# Patient Record
Sex: Female | Born: 1960 | Race: Black or African American | Hispanic: No | Marital: Single | Smoking: Current every day smoker
Health system: Southern US, Community
[De-identification: ages and names within clinical notes are randomized; demographics above are authoritative.]

## PROBLEM LIST (undated history)

## (undated) DIAGNOSIS — I10 Essential (primary) hypertension: Secondary | ICD-10-CM

## (undated) DIAGNOSIS — D369 Benign neoplasm, unspecified site: Secondary | ICD-10-CM

## (undated) DIAGNOSIS — K219 Gastro-esophageal reflux disease without esophagitis: Secondary | ICD-10-CM

## (undated) DIAGNOSIS — D649 Anemia, unspecified: Secondary | ICD-10-CM

## (undated) DIAGNOSIS — F419 Anxiety disorder, unspecified: Secondary | ICD-10-CM

## (undated) DIAGNOSIS — H269 Unspecified cataract: Secondary | ICD-10-CM

## (undated) DIAGNOSIS — E785 Hyperlipidemia, unspecified: Secondary | ICD-10-CM

## (undated) HISTORY — DX: Gastro-esophageal reflux disease without esophagitis: K21.9

## (undated) HISTORY — DX: Unspecified cataract: H26.9

## (undated) HISTORY — DX: Benign neoplasm, unspecified site: D36.9

## (undated) HISTORY — PX: COLONOSCOPY: SHX174

## (undated) HISTORY — DX: Anemia, unspecified: D64.9

## (undated) HISTORY — DX: Anxiety disorder, unspecified: F41.9

## (undated) HISTORY — DX: Hyperlipidemia, unspecified: E78.5

## (undated) HISTORY — DX: Essential (primary) hypertension: I10

## (undated) HISTORY — PX: UPPER GASTROINTESTINAL ENDOSCOPY: SHX188

## (undated) HISTORY — PX: POLYPECTOMY: SHX149

---

## 1989-07-17 HISTORY — PX: TUBAL LIGATION: SHX77

## 1999-03-09 ENCOUNTER — Encounter: Admission: RE | Admit: 1999-03-09 | Discharge: 1999-04-06 | Payer: Self-pay

## 1999-04-12 ENCOUNTER — Encounter: Admission: RE | Admit: 1999-04-12 | Discharge: 1999-05-10 | Payer: Self-pay

## 2000-03-15 ENCOUNTER — Other Ambulatory Visit: Admission: RE | Admit: 2000-03-15 | Discharge: 2000-03-15 | Payer: Self-pay | Admitting: Family Medicine

## 2000-06-19 ENCOUNTER — Emergency Department (HOSPITAL_COMMUNITY): Admission: EM | Admit: 2000-06-19 | Discharge: 2000-06-20 | Payer: Self-pay | Admitting: Emergency Medicine

## 2000-06-19 ENCOUNTER — Encounter: Payer: Self-pay | Admitting: Emergency Medicine

## 2001-06-04 ENCOUNTER — Other Ambulatory Visit: Admission: RE | Admit: 2001-06-04 | Discharge: 2001-06-04 | Payer: Self-pay | Admitting: Family Medicine

## 2003-05-29 ENCOUNTER — Other Ambulatory Visit: Admission: RE | Admit: 2003-05-29 | Discharge: 2003-05-29 | Payer: Self-pay | Admitting: Family Medicine

## 2003-06-04 ENCOUNTER — Encounter: Admission: RE | Admit: 2003-06-04 | Discharge: 2003-06-04 | Payer: Self-pay | Admitting: Family Medicine

## 2003-07-02 ENCOUNTER — Other Ambulatory Visit: Admission: RE | Admit: 2003-07-02 | Discharge: 2003-07-02 | Payer: Self-pay | Admitting: Obstetrics and Gynecology

## 2004-01-13 ENCOUNTER — Encounter: Admission: RE | Admit: 2004-01-13 | Discharge: 2004-01-13 | Payer: Self-pay | Admitting: Family Medicine

## 2004-08-31 ENCOUNTER — Ambulatory Visit: Payer: Self-pay | Admitting: Family Medicine

## 2004-11-07 ENCOUNTER — Ambulatory Visit: Payer: Self-pay | Admitting: Family Medicine

## 2004-12-06 ENCOUNTER — Other Ambulatory Visit: Admission: RE | Admit: 2004-12-06 | Discharge: 2004-12-06 | Payer: Self-pay | Admitting: Obstetrics and Gynecology

## 2005-02-27 ENCOUNTER — Ambulatory Visit: Payer: Self-pay | Admitting: Family Medicine

## 2005-10-10 ENCOUNTER — Ambulatory Visit: Payer: Self-pay | Admitting: Family Medicine

## 2005-12-26 ENCOUNTER — Ambulatory Visit: Payer: Self-pay | Admitting: Family Medicine

## 2006-12-12 ENCOUNTER — Emergency Department (HOSPITAL_COMMUNITY): Admission: EM | Admit: 2006-12-12 | Discharge: 2006-12-12 | Payer: Self-pay | Admitting: Emergency Medicine

## 2006-12-13 ENCOUNTER — Ambulatory Visit: Payer: Self-pay | Admitting: Family Medicine

## 2006-12-14 DIAGNOSIS — F410 Panic disorder [episodic paroxysmal anxiety] without agoraphobia: Secondary | ICD-10-CM | POA: Insufficient documentation

## 2006-12-17 ENCOUNTER — Telehealth (INDEPENDENT_AMBULATORY_CARE_PROVIDER_SITE_OTHER): Payer: Self-pay | Admitting: *Deleted

## 2006-12-19 ENCOUNTER — Ambulatory Visit: Payer: Self-pay | Admitting: Family Medicine

## 2006-12-19 DIAGNOSIS — J0120 Acute ethmoidal sinusitis, unspecified: Secondary | ICD-10-CM

## 2006-12-21 LAB — CONVERTED CEMR LAB
ALT: 11 units/L (ref 0–40)
Alkaline Phosphatase: 43 units/L (ref 39–117)
BUN: 7 mg/dL (ref 6–23)
Bilirubin, Direct: 0.1 mg/dL (ref 0.0–0.3)
Calcium: 9.4 mg/dL (ref 8.4–10.5)
Eosinophils Absolute: 0.1 10*3/uL (ref 0.0–0.6)
Ferritin: 3.8 ng/mL — ABNORMAL LOW (ref 10.0–291.0)
GFR calc Af Amer: 78 mL/min
GFR calc non Af Amer: 64 mL/min
Glucose, Bld: 108 mg/dL — ABNORMAL HIGH (ref 70–99)
Lymphocytes Relative: 34.2 % (ref 12.0–46.0)
MCHC: 33.7 g/dL (ref 30.0–36.0)
MCV: 72.9 fL — ABNORMAL LOW (ref 78.0–100.0)
Monocytes Relative: 6.5 % (ref 3.0–11.0)
Neutro Abs: 4.1 10*3/uL (ref 1.4–7.7)
Platelets: 391 10*3/uL (ref 150–400)
Potassium: 3.8 meq/L (ref 3.5–5.1)
Saturation Ratios: 4.1 % — ABNORMAL LOW (ref 20.0–50.0)
Transferrin: 347.2 mg/dL (ref >=212.0)

## 2007-01-17 ENCOUNTER — Ambulatory Visit: Payer: Self-pay | Admitting: Family Medicine

## 2007-01-21 LAB — CONVERTED CEMR LAB
Basophils Absolute: 0.1 10*3/uL (ref 0.0–0.1)
Eosinophils Absolute: 0.2 10*3/uL (ref 0.0–0.6)
HCT: 38.7 % (ref 36.0–46.0)
MCHC: 33.1 g/dL (ref 30.0–36.0)
Neutrophils Relative %: 66.2 % (ref 43.0–77.0)
Platelets: 334 10*3/uL (ref 150–400)
RBC: 4.8 M/uL (ref 3.87–5.11)
RDW: 22.6 % — ABNORMAL HIGH (ref 11.5–14.6)

## 2007-04-29 ENCOUNTER — Ambulatory Visit: Payer: Self-pay | Admitting: Family Medicine

## 2007-04-29 DIAGNOSIS — E669 Obesity, unspecified: Secondary | ICD-10-CM | POA: Insufficient documentation

## 2007-04-29 DIAGNOSIS — Z8739 Personal history of other diseases of the musculoskeletal system and connective tissue: Secondary | ICD-10-CM | POA: Insufficient documentation

## 2007-05-07 ENCOUNTER — Encounter (INDEPENDENT_AMBULATORY_CARE_PROVIDER_SITE_OTHER): Payer: Self-pay | Admitting: *Deleted

## 2007-06-12 ENCOUNTER — Telehealth (INDEPENDENT_AMBULATORY_CARE_PROVIDER_SITE_OTHER): Payer: Self-pay | Admitting: *Deleted

## 2007-06-19 ENCOUNTER — Telehealth (INDEPENDENT_AMBULATORY_CARE_PROVIDER_SITE_OTHER): Payer: Self-pay | Admitting: *Deleted

## 2007-11-19 ENCOUNTER — Encounter: Payer: Self-pay | Admitting: Family Medicine

## 2008-06-18 ENCOUNTER — Ambulatory Visit: Payer: Self-pay | Admitting: Family Medicine

## 2008-06-18 DIAGNOSIS — N760 Acute vaginitis: Secondary | ICD-10-CM | POA: Insufficient documentation

## 2008-06-19 ENCOUNTER — Encounter (INDEPENDENT_AMBULATORY_CARE_PROVIDER_SITE_OTHER): Payer: Self-pay | Admitting: *Deleted

## 2008-06-19 ENCOUNTER — Telehealth (INDEPENDENT_AMBULATORY_CARE_PROVIDER_SITE_OTHER): Payer: Self-pay | Admitting: *Deleted

## 2008-06-19 LAB — CONVERTED CEMR LAB
Chlamydia, DNA Probe: NEGATIVE
GC Probe Amp, Genital: NEGATIVE

## 2008-11-12 ENCOUNTER — Telehealth (INDEPENDENT_AMBULATORY_CARE_PROVIDER_SITE_OTHER): Payer: Self-pay | Admitting: *Deleted

## 2009-04-01 ENCOUNTER — Ambulatory Visit: Payer: Self-pay | Admitting: Family Medicine

## 2009-04-01 DIAGNOSIS — D239 Other benign neoplasm of skin, unspecified: Secondary | ICD-10-CM | POA: Insufficient documentation

## 2009-04-01 DIAGNOSIS — M26629 Arthralgia of temporomandibular joint, unspecified side: Secondary | ICD-10-CM

## 2009-04-26 ENCOUNTER — Encounter: Payer: Self-pay | Admitting: Family Medicine

## 2009-07-05 ENCOUNTER — Telehealth: Payer: Self-pay | Admitting: Family Medicine

## 2009-09-06 ENCOUNTER — Telehealth: Payer: Self-pay | Admitting: Family Medicine

## 2009-10-22 ENCOUNTER — Telehealth: Payer: Self-pay | Admitting: Family Medicine

## 2009-11-10 ENCOUNTER — Telehealth: Payer: Self-pay | Admitting: Family Medicine

## 2009-12-08 ENCOUNTER — Encounter: Payer: Self-pay | Admitting: Family Medicine

## 2009-12-08 ENCOUNTER — Telehealth (INDEPENDENT_AMBULATORY_CARE_PROVIDER_SITE_OTHER): Payer: Self-pay | Admitting: *Deleted

## 2009-12-08 ENCOUNTER — Telehealth: Payer: Self-pay | Admitting: Family Medicine

## 2009-12-08 ENCOUNTER — Ambulatory Visit: Payer: Self-pay | Admitting: Family

## 2009-12-08 DIAGNOSIS — R030 Elevated blood-pressure reading, without diagnosis of hypertension: Secondary | ICD-10-CM

## 2009-12-09 LAB — CONVERTED CEMR LAB
Trich, Wet Prep: NONE SEEN
Yeast Wet Prep HPF POC: NONE SEEN

## 2009-12-14 ENCOUNTER — Telehealth (INDEPENDENT_AMBULATORY_CARE_PROVIDER_SITE_OTHER): Payer: Self-pay | Admitting: *Deleted

## 2009-12-14 LAB — CONVERTED CEMR LAB: Chlamydia, DNA Probe: NEGATIVE

## 2010-01-20 ENCOUNTER — Telehealth: Payer: Self-pay | Admitting: Family Medicine

## 2010-02-10 ENCOUNTER — Other Ambulatory Visit: Admission: RE | Admit: 2010-02-10 | Discharge: 2010-02-10 | Payer: Self-pay | Admitting: Family Medicine

## 2010-02-10 ENCOUNTER — Ambulatory Visit: Payer: Self-pay | Admitting: Family Medicine

## 2010-02-10 DIAGNOSIS — D509 Iron deficiency anemia, unspecified: Secondary | ICD-10-CM | POA: Insufficient documentation

## 2010-02-10 DIAGNOSIS — F411 Generalized anxiety disorder: Secondary | ICD-10-CM | POA: Insufficient documentation

## 2010-02-11 ENCOUNTER — Telehealth (INDEPENDENT_AMBULATORY_CARE_PROVIDER_SITE_OTHER): Payer: Self-pay | Admitting: *Deleted

## 2010-02-11 LAB — CONVERTED CEMR LAB
AST: 20 units/L (ref 0–37)
Albumin: 4 g/dL (ref 3.5–5.2)
Alkaline Phosphatase: 38 units/L — ABNORMAL LOW (ref 39–117)
Basophils Relative: 0.4 % (ref 0.0–3.0)
Bilirubin, Direct: 0.1 mg/dL (ref 0.0–0.3)
Calcium: 9 mg/dL (ref 8.4–10.5)
Creatinine, Ser: 0.8 mg/dL (ref 0.4–1.2)
Eosinophils Absolute: 0.1 10*3/uL (ref 0.0–0.7)
Ferritin: 2.2 ng/mL — ABNORMAL LOW (ref 10.0–291.0)
Folate: 12 ng/mL
GFR calc non Af Amer: 98.85 mL/min (ref >60)
HDL: 51.9 mg/dL (ref >39.00)
Hemoglobin: 8.5 g/dL — ABNORMAL LOW (ref 12.0–15.0)
LDL Cholesterol: 116 mg/dL — ABNORMAL HIGH (ref 0–99)
Lymphocytes Relative: 30.8 % (ref 12.0–46.0)
MCHC: 31.5 g/dL (ref 30.0–36.0)
Monocytes Relative: 6.2 % (ref 3.0–12.0)
Neutro Abs: 4.3 10*3/uL (ref 1.4–7.7)
Neutrophils Relative %: 61.3 % (ref 43.0–77.0)
RBC: 3.76 M/uL — ABNORMAL LOW (ref 3.87–5.11)
Saturation Ratios: 3.5 % — ABNORMAL LOW (ref 20.0–50.0)
Sodium: 139 meq/L (ref 135–145)
Total CHOL/HDL Ratio: 4
Total Protein: 7 g/dL (ref 6.0–8.3)
Transferrin: 345.4 mg/dL (ref 212.0–360.0)
Triglycerides: 91 mg/dL (ref 0.0–149.0)
VLDL: 18.2 mg/dL (ref 0.0–40.0)
WBC: 7.1 10*3/uL (ref 4.5–10.5)

## 2010-02-16 ENCOUNTER — Ambulatory Visit: Payer: Self-pay | Admitting: Internal Medicine

## 2010-02-16 LAB — CONVERTED CEMR LAB: Pap Smear: NEGATIVE

## 2010-02-22 ENCOUNTER — Encounter: Payer: Self-pay | Admitting: Family Medicine

## 2010-02-22 LAB — CBC & DIFF AND RETIC
BASO%: 0.4 % (ref 0.0–2.0)
EOS%: 1.3 % (ref 0.0–7.0)
Immature Retic Fract: 16.3 % — ABNORMAL HIGH (ref 0.00–10.70)
MCH: 21.5 pg — ABNORMAL LOW (ref 25.1–34.0)
MCV: 73.3 fL — ABNORMAL LOW (ref 79.5–101.0)
MONO%: 6.4 % (ref 0.0–14.0)
RBC: 4.46 10*6/uL (ref 3.70–5.45)
RDW: 18.3 % — ABNORMAL HIGH (ref 11.2–14.5)
Retic %: 0.91 % (ref 0.50–1.50)
Retic Ct Abs: 40.59 10*3/uL (ref 18.30–72.70)
lymph#: 1.3 10*3/uL (ref 0.9–3.3)

## 2010-02-24 LAB — IRON AND TIBC
Iron: 18 ug/dL — ABNORMAL LOW (ref 42–145)
UIBC: 510 ug/dL

## 2010-02-24 LAB — COMPREHENSIVE METABOLIC PANEL
AST: 16 U/L (ref 0–37)
Alkaline Phosphatase: 52 U/L (ref 39–117)
BUN: 8 mg/dL (ref 6–23)
Calcium: 9.4 mg/dL (ref 8.4–10.5)
Creatinine, Ser: 0.92 mg/dL (ref 0.40–1.20)
Total Bilirubin: 0.4 mg/dL (ref 0.3–1.2)

## 2010-02-24 LAB — PROTEIN ELECTROPHORESIS, SERUM
Albumin ELP: 56.6 % (ref 55.8–66.1)
Alpha-1-Globulin: 4.1 % (ref 2.9–4.9)
Alpha-2-Globulin: 10.6 % (ref 7.1–11.8)
Beta 2: 4.8 % (ref 3.2–6.5)
Beta Globulin: 7.3 % — ABNORMAL HIGH (ref 4.7–7.2)

## 2010-05-16 ENCOUNTER — Ambulatory Visit: Payer: Self-pay | Admitting: Internal Medicine

## 2010-08-18 NOTE — Progress Notes (Signed)
Summary: Refill Request  Phone Note Refill Request Message from:  Pharmacy on [facility] [address] Fax #: [phone]  Refills Requested: Medication #1:  XANAX 0.25 MG  TABS take one half to one tablet every eight hours as needed for anxiety   Dosage confirmed as above?Dosage Confirmed   Brand Name Necessary? No   Last Refilled: 07/05/2009 Initial call taken by: Harold Barban,  September 06, 2009 9:14 AM  Follow-up for Phone Call        last ov- 04/01/09, last filled 07/05/09. Army Fossa CMA  September 06, 2009 9:36 AM   Additional Follow-up for Phone Call Additional follow up Details #1::        ok to refill x1  1 refills Additional Follow-up by: Loreen Freud DO,  September 06, 2009 10:30 AM    Prescriptions: Prudy Feeler 0.25 MG  TABS (ALPRAZOLAM) take one half to one tablet every eight hours as needed for anxiety  #20 x 1   Entered by:   Army Fossa CMA   Authorized by:   Loreen Freud DO   Signed by:   Army Fossa CMA on 09/06/2009   Method used:   Printed then faxed to ...       [id number] (retail)       [address]       [place], [place]  [address]       Ph: [phone] or [phone]       Fax: [phone]   RxID:   [id number]

## 2010-08-18 NOTE — Consult Note (Signed)
[facility]   Imported By: Lanelle Bal 03/15/2010 09:51:02  _____________________________________________________________________  External Attachment:    Type:   Image     Comment:   External Document

## 2010-08-18 NOTE — Progress Notes (Signed)
Summary: refill  Phone Note Refill Request Message from:  Fax from Pharmacy on January 20, 2010 11:06 AM  Refills Requested: Medication #1:  XANAX 0.25 MG  TABS take one half to one tablet every eight hours as needed for anxiety [facility] [address] [facility] fax [phone] Jani Files [phone]  Initial call taken by: Okey Regal Spring,  January 20, 2010 11:07 AM  Follow-up for Phone Call        last ov 12-08-09, last filled 12-09-09#30..........Marland KitchenFelecia Deloach CMA  January 20, 2010 3:14 PM   Additional Follow-up for Phone Call Additional follow up Details #1::        refill x1 1 refills Additional Follow-up by: Loreen Freud DO,  January 20, 2010 3:40 PM    Prescriptions: XANAX 0.25 MG  TABS (ALPRAZOLAM) take one half to one tablet every eight hours as needed for anxiety  #30 x 1   Entered by:   Jeremy Johann CMA   Authorized by:   Loreen Freud DO   Signed by:   Jeremy Johann CMA on 01/20/2010   Method used:   Printed then faxed to ...       [id number] (retail)       [address]       [place], [place]  [address]       Ph: [phone] or [phone]       Fax: [phone]   RxID:   [id number]

## 2010-08-18 NOTE — Progress Notes (Signed)
Summary: refill  Phone Note Refill Request Message from:  Fax from Pharmacy on Dec 08, 2009 3:33 PM  Refills Requested: Medication #1:  XANAX 0.25 MG  TABS take one half to one tablet every eight hours as needed for anxiety   Last Refilled: 11/10/2009 fax from [address] - fax [phone] -- ph [phone]   Method Requested: Fax to Local Pharmacy Initial call taken by: Okey Regal Spring,  Dec 08, 2009 3:36 PM  Follow-up for Phone Call        last ov- 12/08/09. Army Fossa CMA  Dec 08, 2009 3:37 PM   Additional Follow-up for Phone Call Additional follow up Details #1::        refill x1  Additional Follow-up by: Loreen Freud DO,  Dec 09, 2009 8:35 AM    Prescriptions: Prudy Feeler 0.25 MG  TABS (ALPRAZOLAM) take one half to one tablet every eight hours as needed for anxiety  #30 x 0   Entered by:   Army Fossa CMA   Authorized by:   Loreen Freud DO   Signed by:   Army Fossa CMA on 12/09/2009   Method used:   Printed then faxed to ...       [id number] (retail)       [address]       [place], [place]  [address]       Ph: [phone] or [phone]       Fax: [phone]   RxID:   [id number]

## 2010-08-18 NOTE — Assessment & Plan Note (Signed)
Summary: CPX,WANTS PAP,PT FASTING,[employer] INS/RH......Marland Kitchen   Vital Signs:  Patient profile:   50 year old female Height:      64.5 inches Weight:      141 pounds Temp:     97.8 degrees F oral Pulse rate:   66 / minute Resp:     18 per minute BP sitting:   130 / 90  (left arm)  Vitals Entered By: Jeremy Johann CMA (February 10, 2010 1:13 PM) CC: cpx,pap, fasting   History of Present Illness: Pt here for cpe, pap and labs.  No complaints.    Preventive Screening-Counseling & Management  Alcohol-Tobacco     Alcohol drinks/day: <1     Alcohol type: wine     Smoking Status: quit     Smoke Cessation Stage: quit     Packs/Day: 1.0     Year Started: 1983     Year Quit: 2002  Caffeine-Diet-Exercise     Caffeine use/day: 1     Diet Comments: healthy     Does Patient Exercise: yes     Type of exercise: walking , aerobics classes      Exercise (avg: min/session): >60     Times/week: 4  Hep-HIV-STD-Contraception     Dental Visit-last 6 months no     Dental Care Counseling: to seek dental care; no dental care within six months     SBE monthly: yes     SBE Education/Counseling: not indicated; SBE done regularly  Safety-Violence-Falls     Seat Belt Use: yes     Sexual Abuse: no      Sexual History:  currently monogamous.        Drug Use:  no.    Current Medications (verified): 1)  Xanax 0.25 Mg  Tabs (Alprazolam) .... Take One Half To One Tablet Every Eight Hours As Needed For Anxiety  Allergies (verified): 1)  ! Codeine  Past History:  Past Medical History: Acne Anxiety  Past Surgical History: Tubal ligation (1993)  Family History: Family History of Anxiety Family History Diabetes 1st degree relative Family History Hypertension Family History Psychiatric care Family History of Alcoholism/Addiction Family History of Stroke F 48 yo  Social History: Occupation:  Education officer, environmental Single Former Smoker Alcohol use-yes Drug use-no Regular exercise-yes Does Patient  Exercise:  yes Smoking Status:  quit Packs/Day:  1.0 Caffeine use/day:  1 Dental Care w/in 6 mos.:  no Seat Belt Use:  yes Sexual History:  currently monogamous Occupation:  employed Drug Use:  no  Review of Systems      See HPI General:  Denies chills, fatigue, fever, loss of appetite, malaise, sleep disorder, sweats, weakness, and weight loss. Eyes:  Denies blurring, discharge, double vision, eye irritation, eye pain, halos, itching, light sensitivity, red eye, vision loss-1 eye, and vision loss-both eyes; optho q1y--  + contacts. ENT:  Denies decreased hearing, difficulty swallowing, ear discharge, earache, hoarseness, nasal congestion, nosebleeds, postnasal drainage, ringing in ears, sinus pressure, and sore throat. CV:  Denies bluish discoloration of lips or nails, chest pain or discomfort, difficulty breathing at night, difficulty breathing while lying down, fainting, fatigue, leg cramps with exertion, lightheadness, near fainting, palpitations, shortness of breath with exertion, swelling of feet, swelling of hands, and weight gain. Resp:  Denies chest discomfort, chest pain with inspiration, cough, coughing up blood, excessive snoring, hypersomnolence, morning headaches, pleuritic, shortness of breath, sputum productive, and wheezing. GI:  Denies abdominal pain, bloody stools, change in bowel habits, constipation, dark tarry stools, diarrhea, excessive  appetite, gas, hemorrhoids, indigestion, loss of appetite, and nausea. GU:  Denies abnormal vaginal bleeding, decreased libido, discharge, dysuria, genital sores, hematuria, incontinence, nocturia, urinary frequency, and urinary hesitancy. MS:  Denies joint pain, joint redness, joint swelling, loss of strength, low back pain, mid back pain, muscle aches, muscle , cramps, muscle weakness, stiffness, and thoracic pain. Derm:  Denies changes in color of skin, changes in nail beds, dryness, excessive perspiration, flushing, hair loss, insect  bite(s), itching, lesion(s), poor wound healing, and rash. Neuro:  Denies brief paralysis, difficulty with concentration, disturbances in coordination, falling down, headaches, inability to speak, memory loss, numbness, poor balance, seizures, sensation of room spinning, tingling, tremors, visual disturbances, and weakness. Psych:  Denies alternate hallucination ( auditory/visual), anxiety, depression, easily angered, easily tearful, irritability, mental problems, panic attacks, sense of great danger, suicidal thoughts/plans, thoughts of violence, unusual visions or sounds, and thoughts /plans of harming others. Endo:  Denies cold intolerance, excessive hunger, excessive thirst, excessive urination, heat intolerance, polyuria, and weight change. Heme:  Denies abnormal bruising, bleeding, enlarge lymph nodes, fevers, pallor, and skin discoloration. Allergy:  Denies hives or rash, itching eyes, persistent infections, seasonal allergies, and sneezing.  Physical Exam  General:  Well-developed,well-nourished,in no acute distress; alert,appropriate and cooperative throughout examination Head:  Normocephalic and atraumatic without obvious abnormalities. No apparent alopecia or balding. Eyes:  vision grossly intact, pupils equal, pupils round, pupils reactive to light, and no injection.   Ears:  External ear exam shows no significant lesions or deformities.  Otoscopic examination reveals clear canals, tympanic membranes are intact bilaterally without bulging, retraction, inflammation or discharge. Hearing is grossly normal bilaterally. Nose:  External nasal examination shows no deformity or inflammation. Nasal mucosa are pink and moist without lesions or exudates. Mouth:  Oral mucosa and oropharynx without lesions or exudates.  Teeth in good repair. Neck:  No deformities, masses, or tenderness noted. Chest Wall:  No deformities, masses, or tenderness noted. Breasts:  No mass, nodules, thickening,  tenderness, bulging, retraction, inflamation, nipple discharge or skin changes noted.   Lungs:  Normal respiratory effort, chest expands symmetrically. Lungs are clear to auscultation, no crackles or wheezes. Heart:  normal rate and no murmur.   Abdomen:  Bowel sounds positive,abdomen soft and non-tender without masses, organomegaly or hernias noted. Rectal:  No external abnormalities noted. Normal sphincter tone. No rectal masses or tenderness. heme neg brown stool Genitalia:  Pelvic Exam:        External: normal female genitalia without lesions or masses        Vagina: normal without lesions or masses        Cervix: normal without lesions or masses        Adnexa: normal bimanual exam without masses or fullness        Uterus: normal by palpation        Pap smear: performed Msk:  normal ROM, no joint tenderness, no joint swelling, no joint warmth, no redness over joints, no joint deformities, no joint instability, and no crepitation.   Pulses:  R posterior tibial normal, R dorsalis pedis normal, R carotid normal, L posterior tibial normal, L dorsalis pedis normal, and L carotid normal.   Extremities:  No clubbing, cyanosis, edema, or deformity noted with normal full range of motion of all joints.   Neurologic:  No cranial nerve deficits noted. Station and gait are normal. Plantar reflexes are down-going bilaterally. DTRs are symmetrical throughout. Sensory, motor and coordinative functions appear intact. Skin:  Intact without suspicious lesions or  rashes Cervical Nodes:  No lymphadenopathy noted Axillary Nodes:  No palpable lymphadenopathy Psych:  Cognition and judgment appear intact. Alert and cooperative with normal attention span and concentration. No apparent delusions, illusions, hallucinations   Impression & Recommendations:  Problem # 1:  PREVENTIVE HEALTH CARE (ICD-V70.0)  Orders: Radiology Referral (Radiology) Venipuncture ([address]) TLB-Lipid Panel (80061-LIPID) TLB-BMP (Basic  Metabolic Panel-BMET) (80048-METABOL) TLB-CBC Platelet - w/Differential (85025-CBCD) TLB-Hepatic/Liver Function Pnl (80076-HEPATIC) TLB-TSH (Thyroid Stimulating Hormone) (84443-TSH) EKG w/ Interpretation ([address])  Problem # 2:  ANEMIA, IRON DEFICIENCY (ICD-280.9)  Orders: TLB-IBC Pnl (Iron/FE;Transferrin) (83550-IBC) TLB-B12 + Folate Pnl (60454_09811-B14/NWG) TLB-Ferritin (82728-FER) EKG w/ Interpretation ([address])  Hgb: 12.8 (01/17/2007)   Hct: 38.7 (01/17/2007)   Platelets: 334 (01/17/2007) RBC: 4.80 (01/17/2007)   RDW: 22.6 (01/17/2007)   WBC: 8.5 (01/17/2007) MCV: 80.6 (01/17/2007)   MCHC: 33.1 (01/17/2007) Ferritin: 3.8 (12/13/2006) Iron: 20 (12/13/2006)   % Sat: 4.1 (12/13/2006) TSH: 1.99 (12/13/2006)  Problem # 3:  ANXIETY (ICD-300.00)  Her updated medication list for this problem includes:    Alprazolam 0.5 Mg Tabs (Alprazolam) .Marland Kitchen... 1 by mouth once daily as needed  Orders: EKG w/ Interpretation ([address])  Complete Medication List: 1)  Alprazolam 0.5 Mg Tabs (Alprazolam) .Marland Kitchen.. 1 by mouth once daily as needed 2)  Caltrate 600+d Plus 600-400 Mg-unit Chew (Calcium carbonate-vit d-min) .Marland Kitchen.. 1 by mouth two times a day 3)  Womens Multivitamin Plus Tabs (Multiple vitamins-minerals) .Marland Kitchen.. 1 by mouth once daily  Patient Instructions: 1)  take MVI with iron ---we will call with labs results Prescriptions: ALPRAZOLAM 0.5 MG TABS (ALPRAZOLAM) 1 by mouth once daily as needed  #30 x 2   Entered and Authorized by:   Loreen Freud DO   Signed by:   Loreen Freud DO on 02/10/2010   Method used:   Print then Give to Patient   RxID:   [id number]     EKG  Procedure date:  02/10/2010  Findings:      Sinus bradycardia with rate of:  53 bpm   Flu Vaccine Next Due:  Refused  Appended Document: CPX,WANTS PAP,PT FASTING,UHC INS/RH......Marland Kitchen   Appended Document: CPX,WANTS PAP,PT FASTING,UHC INS/RH......Marland Kitchen  Laboratory Results   Urine Tests   Date/Time Reported: February 10, 2010  2:01 PM   Routine Urinalysis   Color: yellow Appearance: Clear Glucose: negative   (Normal Range: Negative) Bilirubin: negative   (Normal Range: Negative) Ketone: negative   (Normal Range: Negative) Spec. Gravity: <1.005   (Normal Range: 1.003-1.035) Blood: negative   (Normal Range: Negative) pH: 6.5   (Normal Range: 5.0-8.0) Protein: negative   (Normal Range: Negative) Urobilinogen: negative   (Normal Range: 0-1) Nitrite: negative   (Normal Range: Negative) Leukocyte Esterace: negative   (Normal Range: Negative)    Comments: Floydene Flock  February 10, 2010 2:02 PM

## 2010-08-18 NOTE — Progress Notes (Signed)
Summary: lab results  Phone Note Outgoing Call   Call placed by: Army Fossa CMA,  Dec 14, 2009 11:21 AM Reason for Call: Discuss lab or test results Summary of Call: Regarding lab results, LMTCB:  Chalyamdia/GC: negative Signed by Loreen Freud DO on 12/14/2009 at 8:35 AM  Follow-up for Phone Call        Pt is aware. Army Fossa CMA  Dec 14, 2009 11:28 AM

## 2010-08-18 NOTE — Assessment & Plan Note (Signed)
Summary: vaginal discharge/odor/cbs   Vital Signs:  Patient profile:   50 year old female Height:      63.5 inches Weight:      138.50 pounds BMI:     24.24 Temp:     98.2 degrees F oral Pulse rate:   78 / minute Pulse rhythm:   regular Resp:     12 per minute BP sitting:   142 / 80  (right arm) Cuff size:   regular  Vitals Entered By: Jean Blair CMA (Dec 08, 2009 11:23 AM) CC: Room 5  Pt states she has had vaginal discharge twice since last Sunday. Notes that is has some odor to it. Is Patient Diabetic? No Comments Pt thought her last pap smear was within the last year with Dr. Laury Axon but we do not have record of one being performed.   Primary Care Provider:  [facility]ne  CC:  Room 5  Pt states she has had vaginal discharge twice since last Sunday. Notes that is has some odor to it.Marland Kitchen  History of Present Illness: Jean Blair is a 49 year old female who presents today with complaint of vaginal odor and milky discharge.  Reports odor is "fishy" , denies vaginal itching. Notes that this odor started after intercourse.  She reports that this is the first time in 1 month that she and her partner had intercourse and symptoms started after intercourse.  She is concerned about the possibility of STD and wishes to be tested.  Allergies: 1)  ! Codeine  Physical Exam  General:  Well-developed,well-nourished,in no acute distress; alert,appropriate and cooperative throughout examination Genitalia:  Pelvic Exam:        External: normal female genitalia without lesions or masses        Vagina: normal without lesions or masses, thin white discharge is noted        Cervix: normal without lesions or masses              Pap smear: not performed   Impression & Recommendations:  Problem # 1:  BACTERIAL VAGINITIS (ICD-616.10) Assessment New Clinically appears to be BV.  Will treat with metrogel.  Will also send wet prep, GC/Chlamydia.   Her updated medication list for this problem includes:    Metrogel-vaginal 0.75 % Gel (Metronidazole) ..... One applicator full in vagina at bedtime x 5 days  Problem # 2:  ELEVATED BLOOD PRESSURE (ICD-796.2) Assessment: New Recommended low sodium diet and follow up with Dr. Laury Axon for CPX and BP follow up.  Pt verbalizes understanding. BP today: 142/80 Prior BP: 138/90 (04/01/2009)  Labs Reviewed: Creat: 1.0 (12/13/2006)  Instructed in low sodium diet (DASH Handout) and behavior modification.    Complete Medication List: 1)  Xanax 0.25 Mg Tabs (Alprazolam) .... Take one half to one tablet every eight hours as needed for anxiety 2)  Metrogel-vaginal 0.75 % Gel (Metronidazole) .... One applicator full in vagina at bedtime x 5 days  Patient Instructions: 1)  Please schedule an appointment with Dr. Laury Axon for a Pap and complete physical, and follow up of your blood pressure. 2)  Call if your symptoms worsen or do not improve. 3)  Limit your Sodium (Salt). Prescriptions: METROGEL-VAGINAL 0.75 % GEL (METRONIDAZOLE) one applicator full in Vagina at bedtime x 5 days  #1 x 0   Entered and Authorized by:   Jean Fillers FNP   Signed by:   Jean Fillers FNP on 12/08/2009   Method used:   Electronically to  [id number])       [address]       [place], [place]  [address]       Ph: [phone] or [phone]       Fax: [phone]   RxID:   [id number]      Current Allergies (reviewed today): ! CODEINE  Appended Document: vaginal discharge/odor/cbs    Clinical Lists Changes  Orders: Added new Test order of T-Wet Prep by Molecular Probe [phone]) - Signed Added new Test order of T-Chlamydia & GC Probe, Genital ([phone]) - Signed

## 2010-08-18 NOTE — Progress Notes (Signed)
Summary: discharge  Phone Note Call from Patient Call back at Home Phone [phone]   Caller: Patient Summary of Call: patient has vaginal discharge with odor - she said she has had this before her ph balance is off - no appts available today Initial call taken by: Okey Regal Spring,  Dec 08, 2009 9:16 AM  Follow-up for Phone Call        left pt detail message OV needed and osullivan has some opening today call to schedule appt................Marland KitchenFelecia Deloach CMA  Dec 08, 2009 9:34 AM   pt going to see osullivan today...........Marland KitchenFelecia Deloach CMA  Dec 08, 2009 10:00 AM

## 2010-08-18 NOTE — Progress Notes (Signed)
Summary: Refill Request  Phone Note Refill Request Call back at [phone] Message from:  Pharmacy on November 10, 2009 12:44 PM  Refills Requested: Medication #1:  XANAX 0.25 MG  TABS take one half to one tablet every eight hours as needed for anxiety   Dosage confirmed as above?Dosage Confirmed   Brand Name Necessary? No   Supply Requested: 1 month   [facility] [address]   Next Appointment Scheduled: none Initial call taken by: Harold Barban,  November 10, 2009 12:44 PM  Follow-up for Phone Call        last ov- 04/01/09. Army Fossa CMA  November 10, 2009 12:52 PM   Additional Follow-up for Phone Call Additional follow up Details #1::        fill #30  no refills Additional Follow-up by: Loreen Freud DO,  November 10, 2009 2:07 PM    Prescriptions: XANAX 0.25 MG  TABS (ALPRAZOLAM) take one half to one tablet every eight hours as needed for anxiety  #30 x 0   Entered by:   Army Fossa CMA   Authorized by:   Loreen Freud DO   Signed by:   Army Fossa CMA on 11/10/2009   Method used:   Printed then faxed to ...       [id number]       [address]       [place], [place]  [address]       Ph: [phone] or [phone]       Fax: [phone]   RxID:   [id number]

## 2010-08-18 NOTE — Progress Notes (Signed)
Summary: -lab results  Phone Note Outgoing Call   Details for Reason: + very anemic!!----take slow Fe two times a day ---we will refer to hematology---to further evaluate and for possible iron  infusion  Summary of Call: Left message on voicemail to call back to office.Signed by Lucious Groves CMA on 02/11/2010 at 1:53 PM  left message to call office................Marland KitchenFelecia Deloach CMA  February 16, 2010 11:54 AM   Follow-up for Phone Call        pt aware................Marland KitchenFelecia Deloach CMA  February 16, 2010 4:07 PM

## 2010-08-18 NOTE — Progress Notes (Signed)
Summary: Refill Request  Phone Note Refill Request Message from:  [facility] [address] Fax #: [phone]  Refills Requested: Medication #1:  XANAX 0.25 MG  TABS take one half to one tablet every eight hours as needed for anxiety   Dosage confirmed as above?Dosage Confirmed   Brand Name Necessary? No   Supply Requested: 1 month   Last Refilled: 09/20/2009 Next Appointment Scheduled: none Initial call taken by: Harold Barban,  October 22, 2009 8:42 AM  Follow-up for Phone Call        last ov- 04/01/2009. Army Fossa CMA  October 22, 2009 8:51 AM   Additional Follow-up for Phone Call Additional follow up Details #1::        refillx1 Additional Follow-up by: Loreen Freud DO,  October 22, 2009 9:01 AM    Prescriptions: Prudy Feeler 0.25 MG  TABS (ALPRAZOLAM) take one half to one tablet every eight hours as needed for anxiety  #20 x 0   Entered by:   Army Fossa CMA   Authorized by:   Loreen Freud DO   Signed by:   Army Fossa CMA on 10/22/2009   Method used:   Printed then faxed to ...       [id number]       [address]       [place], [place]  [address]       Ph: [phone] or [phone]       Fax: [phone]   RxID:   [id number]

## 2010-10-10 ENCOUNTER — Other Ambulatory Visit: Payer: Self-pay | Admitting: Family Medicine

## 2010-10-11 MED ORDER — ALPRAZOLAM 0.5 MG PO TABS: ORAL_TABLET | ORAL | Status: DC

## 2010-10-11 NOTE — Telephone Encounter (Signed)
Rx faxed.    KP 

## 2010-10-11 NOTE — Telephone Encounter (Signed)
Last seen and filled 02/10/10 please advise      KP

## 2011-01-23 ENCOUNTER — Other Ambulatory Visit: Payer: Self-pay | Admitting: Family Medicine

## 2011-01-23 MED ORDER — ALPRAZOLAM 0.5 MG PO TABS: ORAL_TABLET | ORAL | Status: DC

## 2011-01-23 NOTE — Telephone Encounter (Signed)
Letter mailed to schedule and appt     KP

## 2011-01-23 NOTE — Telephone Encounter (Signed)
Refill x1 only Pt needs ov

## 2011-01-23 NOTE — Telephone Encounter (Signed)
Last seen 02/10/10 and filled 10/10/10 please advise    KP

## 2011-11-17 ENCOUNTER — Telehealth: Payer: Self-pay | Admitting: Family Medicine

## 2011-11-17 NOTE — Telephone Encounter (Signed)
Scheduled apt 12/06/11 at 1 pm.   KP

## 2011-11-17 NOTE — Telephone Encounter (Signed)
Patient states she needs a CPE and medication refills She needs to be seen before 5.20.13, because her job will take her out of town for 2-3 months Can she come in for a medication f/u 1st & schedule a CPE for a later date to accomodate her schedule? Patient pH# [phone]

## 2011-11-17 NOTE — Telephone Encounter (Signed)
Can't we take care of it on 5/22?

## 2011-11-20 NOTE — Telephone Encounter (Signed)
LMOM stating made CPE for 5.22.13 at 1pm to hold slot & advised patient we would do a medication f/u appt this week so she can continue to receive her medications.

## 2011-11-20 NOTE — Telephone Encounter (Signed)
Made appt for medication f/u tomorrow at 2:30pm. Patient was unable to do 5.22.13 at 1pm for CPE she will be in [place] the rest of the month. She will call me when she returns to see if something has opened up that fits her schedule

## 2011-11-21 ENCOUNTER — Encounter: Payer: Self-pay | Admitting: Family Medicine

## 2011-11-21 ENCOUNTER — Ambulatory Visit (INDEPENDENT_AMBULATORY_CARE_PROVIDER_SITE_OTHER): Payer: BC Managed Care – PPO | Admitting: Family Medicine

## 2011-11-21 VITALS — BP 120/70 | HR 76 | Temp 98.2°F | Wt 158.6 lb

## 2011-11-21 DIAGNOSIS — E663 Overweight: Secondary | ICD-10-CM

## 2011-11-21 DIAGNOSIS — Z6825 Body mass index (BMI) 25.0-25.9, adult: Secondary | ICD-10-CM

## 2011-11-21 DIAGNOSIS — F411 Generalized anxiety disorder: Secondary | ICD-10-CM

## 2011-11-21 DIAGNOSIS — F419 Anxiety disorder, unspecified: Secondary | ICD-10-CM

## 2011-11-21 MED ORDER — ALPRAZOLAM 0.5 MG PO TABS: ORAL_TABLET | ORAL | Status: DC

## 2011-11-21 MED ORDER — PHENTERMINE HCL 37.5 MG PO TABS: ORAL_TABLET | ORAL | Status: DC

## 2011-11-21 NOTE — Assessment & Plan Note (Signed)
Refill meds stable 

## 2011-11-21 NOTE — Assessment & Plan Note (Signed)
con't with diet and exercise adipex for 1 month rto 1 month

## 2011-11-21 NOTE — Patient Instructions (Signed)
Calorie Counting Diet A calorie counting diet requires you to eat the number of calories that are right for you in a day. Calories are the measurement of how much energy you get from the food you eat. Eating the right amount of calories is important for staying at a healthy weight. If you eat too many calories, your body will store them as fat and you may gain weight. If you eat too few calories, you may lose weight. Counting the number of calories you eat during a day will help you know if you are eating the right amount. A Registered Dietitian can determine how many calories you need in a day. The amount of calories needed varies from person to person. If your goal is to lose weight, you will need to eat fewer calories. Losing weight can benefit you if you are overweight or have health problems such as heart disease, high blood pressure, or diabetes. If your goal is to gain weight, you will need to eat more calories. Gaining weight may be necessary if you have a certain health problem that causes your body to need more energy. TIPS Whether you are increasing or decreasing the number of calories you eat during a day, it may be hard to get used to changes in what you eat and drink. The following are tips to help you keep track of the number of calories you eat.  Measure foods at home with measuring cups. This helps you know the amount of food and number of calories you are eating.   Restaurants often serve food in amounts that are larger than 1 serving. While eating out, estimate how many servings of a food you are given. For example, a serving of cooked rice is  cup or about the size of half of a fist. Knowing serving sizes will help you be aware of how much food you are eating at restaurants.   Ask for smaller portion sizes or child-size portions at restaurants.   Plan to eat half of a meal at a restaurant. Take the rest home or share the other half with a friend.   Read the Nutrition Facts panel on  food labels for calorie content and serving size. You can find out how many servings are in a package, the size of a serving, and the number of calories each serving has.   For example, a package might contain 3 cookies. The Nutrition Facts panel on that package says that 1 serving is 1 cookie. Below that, it will say there are 3 servings in the container. The calories section of the Nutrition Facts label says there are 90 calories. This means there are 90 calories in 1 cookie (1 serving). If you eat 1 cookie you have eaten 90 calories. If you eat all 3 cookies, you have eaten 270 calories (3 servings x 90 calories = 270 calories).  The list below tells you how big or small some common portion sizes are.  1 oz.........4 stacked dice.   3 oz.........Deck of cards.   1 tsp........Tip of little finger.   1 tbs........Thumb.   2 tbs........Golf ball.    cup.......Half of a fist.   1 cup........A fist.  KEEP A FOOD LOG Write down every food item you eat, the amount you eat, and the number of calories in each food you eat during the day. At the end of the day, you can add up the total number of calories you have eaten. It may help to keep a   list like the one below. Find out the calorie information by reading the Nutrition Facts panel on food labels. Breakfast  Bran cereal (1 cup, 110 calories).   Fat-free milk ( cup, 45 calories).  Snack  Apple (1 medium, 80 calories).  Lunch  Spinach (1 cup, 20 calories).   Tomato ( medium, 20 calories).   Chicken breast strips (3 oz, 165 calories).   Shredded cheddar cheese ( cup, 110 calories).   Light Italian dressing (2 tbs, 60 calories).   Whole-wheat bread (1 slice, 80 calories).   Tub margarine (1 tsp, 35 calories).   Vegetable soup (1 cup, 160 calories).  Dinner  Pork chop (3 oz, 190 calories).   Brown rice (1 cup, 215 calories).   Steamed broccoli ( cup, 20 calories).   Strawberries (1  cup, 65 calories).   Whipped  cream (1 tbs, 50 calories).  Daily Calorie Total: 1425 Document Released: 07/03/2005 Document Revised: 06/22/2011 Document Reviewed: 12/28/2006 Exit[employer] Patient Information 2012 [employer].  Exercise to Lose Weight Exercise and a healthy diet may help you lose weight. Your doctor may suggest specific exercises. EXERCISE IDEAS AND TIPS  Choose low-cost things you enjoy doing, such as walking, bicycling, or exercising to workout videos.   Take stairs instead of the elevator.   Walk during your lunch break.   Park your car further away from work or school.   Go to a gym or an exercise class.   Start with 5 to 10 minutes of exercise each day. Build up to 30 minutes of exercise 4 to 6 days a week.   Wear shoes with good support and comfortable clothes.   Stretch before and after working out.   Work out until you breathe harder and your heart beats faster.   Drink extra water when you exercise.   Do not do so much that you hurt yourself, feel dizzy, or get very short of breath.  Exercises that burn about 150 calories:  Running 1  miles in 15 minutes.   Playing volleyball for 45 to 60 minutes.   Washing and waxing a car for 45 to 60 minutes.   Playing touch football for 45 minutes.   Walking 1  miles in 35 minutes.   Pushing a stroller 1  miles in 30 minutes.   Playing basketball for 30 minutes.   Raking leaves for 30 minutes.   Bicycling 5 miles in 30 minutes.   Walking 2 miles in 30 minutes.   Dancing for 30 minutes.   Shoveling snow for 15 minutes.   Swimming laps for 20 minutes.   Walking up stairs for 15 minutes.   Bicycling 4 miles in 15 minutes.   Gardening for 30 to 45 minutes.   Jumping rope for 15 minutes.   Washing windows or floors for 45 to 60 minutes.  Document Released: 08/05/2010 Document Revised: 06/22/2011 Document Reviewed: 08/05/2010 Exit[employer] Patient Information 2012 [employer]. 

## 2011-11-21 NOTE — Progress Notes (Signed)
  Subjective:    Patient ID: Jean Blair, female    DOB: 01-27-1961, 51 y.o.   MRN: [id number]  HPI Pt here for refill of her medication--xanax.  She also c/o difficulty losing weight.  She exercises and watches what she eats but still struggles.     Review of Systems As above    Objective:   Physical Exam  Constitutional: She is oriented to person, place, and time. She appears well-developed and well-nourished.  Cardiovascular: Normal rate, regular rhythm and normal heart sounds.   Pulmonary/Chest: Effort normal and breath sounds normal.  Neurological: She is alert and oriented to person, place, and time.  Psychiatric: She has a normal mood and affect. Her behavior is normal. Judgment and thought content normal.          Assessment & Plan:

## 2011-12-06 ENCOUNTER — Encounter: Payer: Self-pay | Admitting: Family Medicine

## 2012-01-08 ENCOUNTER — Telehealth: Payer: Self-pay | Admitting: Family Medicine

## 2012-01-08 NOTE — Telephone Encounter (Signed)
Needs ov for that med

## 2012-01-08 NOTE — Telephone Encounter (Signed)
refill Phentermine 37.5 MG Tablet #30, take one tablet by mouth every morning, last fill 5.15.13, last ov 5.7.13

## 2012-01-08 NOTE — Telephone Encounter (Signed)
No pending apts.....Please advise    KP 

## 2012-01-08 NOTE — Telephone Encounter (Signed)
Called patient at home left message to call and sch. appt

## 2012-01-08 NOTE — Telephone Encounter (Signed)
Please schedule a follow up     KP

## 2012-03-19 ENCOUNTER — Encounter: Payer: Self-pay | Admitting: Family Medicine

## 2012-03-19 ENCOUNTER — Ambulatory Visit (INDEPENDENT_AMBULATORY_CARE_PROVIDER_SITE_OTHER): Payer: BC Managed Care – PPO | Admitting: Family Medicine

## 2012-03-19 VITALS — BP 116/70 | HR 71 | Temp 98.5°F | Wt 154.8 lb

## 2012-03-19 DIAGNOSIS — B351 Tinea unguium: Secondary | ICD-10-CM | POA: Insufficient documentation

## 2012-03-19 DIAGNOSIS — F411 Generalized anxiety disorder: Secondary | ICD-10-CM

## 2012-03-19 DIAGNOSIS — F419 Anxiety disorder, unspecified: Secondary | ICD-10-CM

## 2012-03-19 LAB — HEPATIC FUNCTION PANEL
ALT: 12 U/L (ref 0–35)
AST: 19 U/L (ref 0–37)
Bilirubin, Direct: 0.1 mg/dL (ref 0.0–0.3)
Total Bilirubin: 0.7 mg/dL (ref 0.3–1.2)

## 2012-03-19 MED ORDER — TERBINAFINE HCL 250 MG PO TABS: 250.0000 mg | ORAL_TABLET | Freq: Every day | ORAL | Status: DC

## 2012-03-19 MED ORDER — ALPRAZOLAM 0.5 MG PO TABS: ORAL_TABLET | ORAL | Status: DC

## 2012-03-19 NOTE — Progress Notes (Signed)
  Subjective:    Patient ID: Jean Blair, female    DOB: 1961/06/20, 51 y.o.   MRN: [id number]  HPI  Pt here c/o yellowing of L big toe nail and separation.  Pt requesting treatment.  Review of Systems As above    Objective:   Physical Exam  Constitutional: She is oriented to person, place, and time. She appears well-developed and well-nourished.  Musculoskeletal: Normal range of motion. She exhibits no edema and no tenderness.  Neurological: She is alert and oriented to person, place, and time.  Skin:       L big toe---  Yellow ,  Separated from toe  Psychiatric: She has a normal mood and affect. Her behavior is normal.          Assessment & Plan:

## 2012-03-19 NOTE — Patient Instructions (Signed)
Ringworm, Nail  A fungal infection of the nail (tinea unguium/onychomycosis) is common. It is common as the visible part of the nail is composed of dead cells which have no blood supply to help prevent infection. It occurs because fungi are everywhere and will pick any opportunity to grow on any dead material.  Because nails are very slow growing they require up to 2 years of treatment with anti-fungal medications. The entire nail back to the base is infected. This includes approximately ? of the nail which you cannot see.  If your caregiver has prescribed a medication by mouth, take it every day and as directed. No progress will be seen for at least 6 to 9 months. Do not be disappointed! Because fungi live on dead cells with little or no exposure to blood supply, medication delivery to the infection is slow; thus the cure is slow. It is also why you can observe no progress in the first 6 months. The nail becoming cured is the base of the nail, as it has the blood supply. Topical medication such as creams and ointments are usually not effective. Important in successful treatment of nail fungus is closely following the medication regimen that your doctor prescribes.  Sometimes you and your caregiver may elect to speed up this process by surgical removal of all the nails. Even this may still require 6 to 9 months of additional oral medications.  See your caregiver as directed. Remember there will be no visible improvement for at least 6 months. See your caregiver sooner if other signs of infection (redness and swelling) develop.  Document Released: 06/30/2000 Document Revised: 06/22/2011 Document Reviewed: 09/08/2008  [employer] Patient Information 2012 [employer].

## 2012-03-19 NOTE — Assessment & Plan Note (Signed)
lamisil for 3 months Check hep now,  6 and 12 weeks

## 2012-04-02 ENCOUNTER — Telehealth: Payer: Self-pay | Admitting: Family Medicine

## 2012-04-02 DIAGNOSIS — B351 Tinea unguium: Secondary | ICD-10-CM

## 2012-04-02 NOTE — Telephone Encounter (Signed)
Pt called stating she needs [place] to call in the fungus medication again because she is out of town and cannot pick it up until next Tuesday. We called it in, but they have since put it back. She would also like her diet pill sent to [facility] on [address]

## 2012-04-02 NOTE — Telephone Encounter (Signed)
lm on triage line at 1243pm RT. call from [place].  Cb# not left

## 2012-04-02 NOTE — Telephone Encounter (Signed)
Left msg for pt to call back

## 2012-04-02 NOTE — Telephone Encounter (Signed)
Left another msg for pt to call back.

## 2012-04-04 MED ORDER — TERBINAFINE HCL 250 MG PO TABS: 250.0000 mg | ORAL_TABLET | Freq: Every day | ORAL | Status: DC

## 2012-04-04 NOTE — Telephone Encounter (Signed)
She needs an OV for Adipex. Please offer this patient an apt.     KP

## 2012-04-05 NOTE — Telephone Encounter (Signed)
Lmovm for pt to call office. °

## 2012-04-10 ENCOUNTER — Telehealth: Payer: Self-pay

## 2012-04-10 ENCOUNTER — Telehealth: Payer: Self-pay | Admitting: Family Medicine

## 2012-04-10 DIAGNOSIS — E663 Overweight: Secondary | ICD-10-CM

## 2012-04-10 MED ORDER — PHENTERMINE HCL 37.5 MG PO TABS: ORAL_TABLET | ORAL | Status: DC

## 2012-04-10 NOTE — Telephone Encounter (Signed)
Opened in Error.

## 2012-04-10 NOTE — Telephone Encounter (Signed)
RX has not been sent-- the patient needs and apt before this medication can be filled. I have called the patient to offer her an apt . Msg left to call     KP

## 2012-04-10 NOTE — Telephone Encounter (Signed)
Refill: Phentermine 37.5 mg tablet. Take 1 tablet by mouth every day. Last fill 11-29-11

## 2012-04-10 NOTE — Telephone Encounter (Signed)
Rx sent.    MW 

## 2012-04-11 ENCOUNTER — Telehealth: Payer: Self-pay

## 2012-04-11 DIAGNOSIS — B351 Tinea unguium: Secondary | ICD-10-CM

## 2012-04-11 MED ORDER — TERBINAFINE HCL 250 MG PO TABS: 250.0000 mg | ORAL_TABLET | Freq: Every day | ORAL | Status: DC

## 2012-04-11 NOTE — Telephone Encounter (Signed)
PA on Lamasil not approved, I made the patient aware and an RX was faxed to [facility] because it is on the 4 dollar list.    KP

## 2012-05-15 NOTE — Telephone Encounter (Signed)
Last seen 03/19/12

## 2012-10-29 ENCOUNTER — Encounter: Payer: Self-pay | Admitting: Lab

## 2012-10-30 ENCOUNTER — Ambulatory Visit (INDEPENDENT_AMBULATORY_CARE_PROVIDER_SITE_OTHER): Payer: BC Managed Care – PPO | Admitting: Family Medicine

## 2012-10-30 ENCOUNTER — Other Ambulatory Visit (HOSPITAL_COMMUNITY)
Admission: RE | Admit: 2012-10-30 | Discharge: 2012-10-30 | Disposition: A | Discharge status: Home / Self Care | Payer: BC Managed Care – PPO | Source: Ambulatory Visit | Attending: Family Medicine | Admitting: Family Medicine

## 2012-10-30 ENCOUNTER — Encounter: Payer: Self-pay | Admitting: Family Medicine

## 2012-10-30 ENCOUNTER — Encounter: Payer: Self-pay | Admitting: Gastroenterology

## 2012-10-30 VITALS — BP 130/80 | HR 74 | Temp 98.3°F | Ht 64.5 in | Wt 160.4 lb

## 2012-10-30 DIAGNOSIS — Z124 Encounter for screening for malignant neoplasm of cervix: Secondary | ICD-10-CM

## 2012-10-30 DIAGNOSIS — Z1151 Encounter for screening for human papillomavirus (HPV): Secondary | ICD-10-CM | POA: Insufficient documentation

## 2012-10-30 DIAGNOSIS — F419 Anxiety disorder, unspecified: Secondary | ICD-10-CM

## 2012-10-30 DIAGNOSIS — E669 Obesity, unspecified: Secondary | ICD-10-CM

## 2012-10-30 DIAGNOSIS — Z1239 Encounter for other screening for malignant neoplasm of breast: Secondary | ICD-10-CM

## 2012-10-30 DIAGNOSIS — Z6825 Body mass index (BMI) 25.0-25.9, adult: Secondary | ICD-10-CM

## 2012-10-30 DIAGNOSIS — F411 Generalized anxiety disorder: Secondary | ICD-10-CM

## 2012-10-30 DIAGNOSIS — E663 Overweight: Secondary | ICD-10-CM

## 2012-10-30 DIAGNOSIS — Z01419 Encounter for gynecological examination (general) (routine) without abnormal findings: Secondary | ICD-10-CM | POA: Insufficient documentation

## 2012-10-30 DIAGNOSIS — Z0000 Encounter for general adult medical examination without abnormal findings: Secondary | ICD-10-CM

## 2012-10-30 LAB — TSH: TSH: 0.97 u[IU]/mL (ref 0.35–5.50)

## 2012-10-30 LAB — CBC WITH DIFFERENTIAL/PLATELET
Basophils Relative: 0.5 % (ref 0.0–3.0)
Eosinophils Absolute: 0.2 10*3/uL (ref 0.0–0.7)
Eosinophils Relative: 3.5 % (ref 0.0–5.0)
Hemoglobin: 9 g/dL — ABNORMAL LOW (ref 12.0–15.0)
Lymphocytes Relative: 22.6 % (ref 12.0–46.0)
Monocytes Relative: 6.5 % (ref 3.0–12.0)
Neutro Abs: 4.4 10*3/uL (ref 1.4–7.7)
Neutrophils Relative %: 66.9 % (ref 43.0–77.0)
RBC: 4.37 Mil/uL (ref 3.87–5.11)
WBC: 6.6 10*3/uL (ref 4.5–10.5)

## 2012-10-30 LAB — BASIC METABOLIC PANEL
BUN: 11 mg/dL (ref 6–23)
CO2: 25 mEq/L (ref 19–32)
Calcium: 9.4 mg/dL (ref 8.4–10.5)
GFR: 92.38 mL/min (ref >60.00)
Glucose, Bld: 93 mg/dL (ref 70–99)
Potassium: 3.7 mEq/L (ref 3.5–5.1)
Sodium: 137 mEq/L (ref 135–145)

## 2012-10-30 LAB — HEPATIC FUNCTION PANEL
AST: 16 U/L (ref 0–37)
Albumin: 3.9 g/dL (ref 3.5–5.2)
Alkaline Phosphatase: 41 U/L (ref 39–117)
Bilirubin, Direct: 0 mg/dL (ref 0.0–0.3)
Total Protein: 7.3 g/dL (ref 6.0–8.3)

## 2012-10-30 LAB — LDL CHOLESTEROL, DIRECT: Direct LDL: 147.1 mg/dL

## 2012-10-30 LAB — LIPID PANEL: HDL: 42.3 mg/dL (ref >39.00)

## 2012-10-30 MED ORDER — ALPRAZOLAM 0.5 MG PO TABS: ORAL_TABLET | ORAL | Status: DC

## 2012-10-30 MED ORDER — PHENTERMINE HCL 37.5 MG PO TABS: ORAL_TABLET | ORAL | Status: DC

## 2012-10-30 NOTE — Assessment & Plan Note (Signed)
Refill xanax

## 2012-10-30 NOTE — Assessment & Plan Note (Signed)
adipex for 3 months max

## 2012-10-30 NOTE — Progress Notes (Signed)
Subjective:     Jean Blair is a 52 y.o. female and is here for a comprehensive physical exam. The patient reports no problems.  History   Social History  . Marital Status: Single    Spouse Name: N/A    Number of Children: N/A  . Years of Education: N/A   Occupational History  . Not on file.   Social History Main Topics  . Smoking status: Former Games developer  . Smokeless tobacco: Never Used  . Alcohol Use: Yes  . Drug Use: No  . Sexually Active: Not on file   Other Topics Concern  . Not on file   Social History Narrative  . No narrative on file   Health Maintenance  Topic Date Due  . Mammogram  01/13/1981  . Pap Smear  01/13/1981  . Tetanus/tdap  01/13/1982  . Influenza Vaccine  03/17/2013    The following portions of the patient's history were reviewed and updated as appropriate:  She  has a past medical history of Anxiety. She  does not have any pertinent problems on file. She  has past surgical history that includes Tubal ligation. Her family history includes Alcohol abuse in an unspecified family member; Anxiety disorder in an unspecified family member; Diabetes in an unspecified family member; Hypertension in an unspecified family member; Mental illness in an unspecified family member; and Stroke in an unspecified family member. She  reports that she has quit smoking. She has never used smokeless tobacco. She reports that  drinks alcohol. She reports that she does not use illicit drugs. She has a current medication list which includes the following prescription(s): alprazolam and phentermine. No current outpatient prescriptions on file prior to visit.   No current facility-administered medications on file prior to visit.   She is allergic to codeine..  Review of Systems Review of Systems  Constitutional: Negative for activity change, appetite change and fatigue.  HENT: Negative for hearing loss, congestion, tinnitus and ear discharge.  dentist q58m Eyes:  Negative for visual disturbance (see optho q1y -- vision corrected to 20/20 with glasses).  Respiratory: Negative for cough, chest tightness and shortness of breath.   Cardiovascular: Negative for chest pain, palpitations and leg swelling.  Gastrointestinal: Negative for abdominal pain, diarrhea, constipation and abdominal distention.  Genitourinary: Negative for urgency, frequency, decreased urine volume and difficulty urinating.  Musculoskeletal: Negative for back pain, arthralgias and gait problem.  Skin: Negative for color change, pallor and rash.  Neurological: Negative for dizziness, light-headedness, numbness and headaches.  Hematological: Negative for adenopathy. Does not bruise/bleed easily.  Psychiatric/Behavioral: Negative for suicidal ideas, confusion, sleep disturbance, self-injury, dysphoric mood, decreased concentration and agitation.       Objective:    BP 130/80  Pulse 74  Temp(Src) 98.3 F (36.8 C) (Oral)  Ht 5' 4.5" (1.638 m)  Wt 160 lb 6.4 oz (72.757 kg)  BMI 27.12 kg/m2  SpO2 97%  LMP 09/22/2012 General appearance: alert, cooperative, appears stated age and no distress Head: Normocephalic, without obvious abnormality, atraumatic Eyes: conjunctivae/corneas clear. PERRL, EOM's intact. Fundi benign. Ears: normal TM's and external ear canals both ears Nose: Nares normal. Septum midline. Mucosa normal. No drainage or sinus tenderness. Throat: lips, mucosa, and tongue normal; teeth and gums normal Neck: no adenopathy, no carotid bruit, no JVD, supple, symmetrical, trachea midline and thyroid not enlarged, symmetric, no tenderness/mass/nodules Back: symmetric, no curvature. ROM normal. No CVA tenderness. Lungs: clear to auscultation bilaterally Breasts: normal appearance, no masses or tenderness Heart: regular rate  and rhythm, S1, S2 normal, no murmur, click, rub or gallop Abdomen: soft, non-tender; bowel sounds normal; no masses,  no organomegaly Pelvic: cervix  normal in appearance, external genitalia normal, no adnexal masses or tenderness, no cervical motion tenderness, rectovaginal septum normal, uterus normal size, shape, and consistency and vagina normal without discharge Extremities: extremities normal, atraumatic, no cyanosis or edema Pulses: 2+ and symmetric Skin: Skin color, texture, turgor normal. No rashes or lesions Lymph nodes: Cervical, supraclavicular, and axillary nodes normal. Neurologic: Alert and oriented X 3, normal strength and tone. Normal symmetric reflexes. Normal coordination and gait Psych-- no anxiety, no depression      Assessment:    Healthy female exam.      Plan:  ghm utd Check labs   See After Visit Summary for Counseling Recommendations

## 2012-10-30 NOTE — Patient Instructions (Signed)
Preventive Care for Adults, Female A healthy lifestyle and preventive care can promote health and wellness. Preventive health guidelines for women include the following key practices.  A routine yearly physical is a good way to check with your caregiver about your health and preventive screening. It is a chance to share any concerns and updates on your health, and to receive a thorough exam.  Visit your dentist for a routine exam and preventive care every 6 months. Brush your teeth twice a day and floss once a day. Good oral hygiene prevents tooth decay and gum disease.  The frequency of eye exams is based on your age, health, family medical history, use of contact lenses, and other factors. Follow your caregiver's recommendations for frequency of eye exams.  Eat a healthy diet. Foods like vegetables, fruits, whole grains, low-fat dairy products, and lean protein foods contain the nutrients you need without too many calories. Decrease your intake of foods high in solid fats, added sugars, and salt. Eat the right amount of calories for you.Get information about a proper diet from your caregiver, if necessary.  Regular physical exercise is one of the most important things you can do for your health. Most adults should get at least 150 minutes of moderate-intensity exercise (any activity that increases your heart rate and causes you to sweat) each week. In addition, most adults need muscle-strengthening exercises on 2 or more days a week.  Maintain a healthy weight. The body mass index (BMI) is a screening tool to identify possible weight problems. It provides an estimate of body fat based on height and weight. Your caregiver can help determine your BMI, and can help you achieve or maintain a healthy weight.For adults 20 years and older:  A BMI below 18.5 is considered underweight.  A BMI of 18.5 to 24.9 is normal.  A BMI of 25 to 29.9 is considered overweight.  A BMI of 30 and above is  considered obese.  Maintain normal blood lipids and cholesterol levels by exercising and minimizing your intake of saturated fat. Eat a balanced diet with plenty of fruit and vegetables. Blood tests for lipids and cholesterol should begin at age 20 and be repeated every 5 years. If your lipid or cholesterol levels are high, you are over 50, or you are at high risk for heart disease, you may need your cholesterol levels checked more frequently.Ongoing high lipid and cholesterol levels should be treated with medicines if diet and exercise are not effective.  If you smoke, find out from your caregiver how to quit. If you do not use tobacco, do not start.  If you are pregnant, do not drink alcohol. If you are breastfeeding, be very cautious about drinking alcohol. If you are not pregnant and choose to drink alcohol, do not exceed 1 drink per day. One drink is considered to be 12 ounces (355 mL) of beer, 5 ounces (148 mL) of wine, or 1.5 ounces (44 mL) of liquor.  Avoid use of street drugs. Do not share needles with anyone. Ask for help if you need support or instructions about stopping the use of drugs.  High blood pressure causes heart disease and increases the risk of stroke. Your blood pressure should be checked at least every 1 to 2 years. Ongoing high blood pressure should be treated with medicines if weight loss and exercise are not effective.  If you are 55 to 50 years old, ask your caregiver if you should take aspirin to prevent strokes.  Diabetes   screening involves taking a blood sample to check your fasting blood sugar level. This should be done once every 3 years, after age 45, if you are within normal weight and without risk factors for diabetes. Testing should be considered at a younger age or be carried out more frequently if you are overweight and have at least 1 risk factor for diabetes.  Breast cancer screening is essential preventive care for women. You should practice "breast  self-awareness." This means understanding the normal appearance and feel of your breasts and may include breast self-examination. Any changes detected, no matter how small, should be reported to a caregiver. Women in their 20s and 30s should have a clinical breast exam (CBE) by a caregiver as part of a regular health exam every 1 to 3 years. After age 40, women should have a CBE every year. Starting at age 40, women should consider having a mammography (breast X-ray test) every year. Women who have a family history of breast cancer should talk to their caregiver about genetic screening. Women at a high risk of breast cancer should talk to their caregivers about having magnetic resonance imaging (MRI) and a mammography every year.  The Pap test is a screening test for cervical cancer. A Pap test can show cell changes on the cervix that might become cervical cancer if left untreated. A Pap test is a procedure in which cells are obtained and examined from the lower end of the uterus (cervix).  Women should have a Pap test starting at age 21.  Between ages 21 and 29, Pap tests should be repeated every 2 years.  Beginning at age 30, you should have a Pap test every 3 years as long as the past 3 Pap tests have been normal.  Some women have medical problems that increase the chance of getting cervical cancer. Talk to your caregiver about these problems. It is especially important to talk to your caregiver if a new problem develops soon after your last Pap test. In these cases, your caregiver may recommend more frequent screening and Pap tests.  The above recommendations are the same for women who have or have not gotten the vaccine for human papillomavirus (HPV).  If you had a hysterectomy for a problem that was not cancer or a condition that could lead to cancer, then you no longer need Pap tests. Even if you no longer need a Pap test, a regular exam is a good idea to make sure no other problems are  starting.  If you are between ages 65 and 70, and you have had normal Pap tests going back 10 years, you no longer need Pap tests. Even if you no longer need a Pap test, a regular exam is a good idea to make sure no other problems are starting.  If you have had past treatment for cervical cancer or a condition that could lead to cancer, you need Pap tests and screening for cancer for at least 20 years after your treatment.  If Pap tests have been discontinued, risk factors (such as a new sexual partner) need to be reassessed to determine if screening should be resumed.  The HPV test is an additional test that may be used for cervical cancer screening. The HPV test looks for the virus that can cause the cell changes on the cervix. The cells collected during the Pap test can be tested for HPV. The HPV test could be used to screen women aged 30 years and older, and should   be used in women of any age who have unclear Pap test results. After the age of 30, women should have HPV testing at the same frequency as a Pap test.  Colorectal cancer can be detected and often prevented. Most routine colorectal cancer screening begins at the age of 50 and continues through age 75. However, your caregiver may recommend screening at an earlier age if you have risk factors for colon cancer. On a yearly basis, your caregiver may provide home test kits to check for hidden blood in the stool. Use of a small camera at the end of a tube, to directly examine the colon (sigmoidoscopy or colonoscopy), can detect the earliest forms of colorectal cancer. Talk to your caregiver about this at age 50, when routine screening begins. Direct examination of the colon should be repeated every 5 to 10 years through age 75, unless early forms of pre-cancerous polyps or small growths are found.  Hepatitis C blood testing is recommended for all people born from 1945 through 1965 and any individual with known risks for hepatitis C.  Practice  safe sex. Use condoms and avoid high-risk sexual practices to reduce the spread of sexually transmitted infections (STIs). STIs include gonorrhea, chlamydia, syphilis, trichomonas, herpes, HPV, and human immunodeficiency virus (HIV). Herpes, HIV, and HPV are viral illnesses that have no cure. They can result in disability, cancer, and death. Sexually active women aged 25 and younger should be checked for chlamydia. Older women with new or multiple partners should also be tested for chlamydia. Testing for other STIs is recommended if you are sexually active and at increased risk.  Osteoporosis is a disease in which the bones lose minerals and strength with aging. This can result in serious bone fractures. The risk of osteoporosis can be identified using a bone density scan. Women ages 65 and over and women at risk for fractures or osteoporosis should discuss screening with their caregivers. Ask your caregiver whether you should take a calcium supplement or vitamin D to reduce the rate of osteoporosis.  Menopause can be associated with physical symptoms and risks. Hormone replacement therapy is available to decrease symptoms and risks. You should talk to your caregiver about whether hormone replacement therapy is right for you.  Use sunscreen with sun protection factor (SPF) of 30 or more. Apply sunscreen liberally and repeatedly throughout the day. You should seek shade when your shadow is shorter than you. Protect yourself by wearing long sleeves, pants, a wide-brimmed hat, and sunglasses year round, whenever you are outdoors.  Once a month, do a whole body skin exam, using a mirror to look at the skin on your back. Notify your caregiver of new moles, moles that have irregular borders, moles that are larger than a pencil eraser, or moles that have changed in shape or color.  Stay current with required immunizations.  Influenza. You need a dose every fall (or winter). The composition of the flu vaccine  changes each year, so being vaccinated once is not enough.  Pneumococcal polysaccharide. You need 1 to 2 doses if you smoke cigarettes or if you have certain chronic medical conditions. You need 1 dose at age 65 (or older) if you have never been vaccinated.  Tetanus, diphtheria, pertussis (Tdap, Td). Get 1 dose of Tdap vaccine if you are younger than age 65, are over 65 and have contact with an infant, are a healthcare worker, are pregnant, or simply want to be protected from whooping cough. After that, you need a Td   booster dose every 10 years. Consult your caregiver if you have not had at least 3 tetanus and diphtheria-containing shots sometime in your life or have a deep or dirty wound.  HPV. You need this vaccine if you are a woman age 26 or younger. The vaccine is given in 3 doses over 6 months.  Measles, mumps, rubella (MMR). You need at least 1 dose of MMR if you were born in 1957 or later. You may also need a second dose.  Meningococcal. If you are age 19 to 21 and a first-year college student living in a residence hall, or have one of several medical conditions, you need to get vaccinated against meningococcal disease. You may also need additional booster doses.  Zoster (shingles). If you are age 60 or older, you should get this vaccine.  Varicella (chickenpox). If you have never had chickenpox or you were vaccinated but received only 1 dose, talk to your caregiver to find out if you need this vaccine.  Hepatitis A. You need this vaccine if you have a specific risk factor for hepatitis A virus infection or you simply wish to be protected from this disease. The vaccine is usually given as 2 doses, 6 to 18 months apart.  Hepatitis B. You need this vaccine if you have a specific risk factor for hepatitis B virus infection or you simply wish to be protected from this disease. The vaccine is given in 3 doses, usually over 6 months. Preventive Services / Frequency Ages 19 to 39  Blood  pressure check.** / Every 1 to 2 years.  Lipid and cholesterol check.** / Every 5 years beginning at age 20.  Clinical breast exam.** / Every 3 years for women in their 20s and 30s.  Pap test.** / Every 2 years from ages 21 through 29. Every 3 years starting at age 30 through age 65 or 70 with a history of 3 consecutive normal Pap tests.  HPV screening.** / Every 3 years from ages 30 through ages 65 to 70 with a history of 3 consecutive normal Pap tests.  Hepatitis C blood test.** / For any individual with known risks for hepatitis C.  Skin self-exam. / Monthly.  Influenza immunization.** / Every year.  Pneumococcal polysaccharide immunization.** / 1 to 2 doses if you smoke cigarettes or if you have certain chronic medical conditions.  Tetanus, diphtheria, pertussis (Tdap, Td) immunization. / A one-time dose of Tdap vaccine. After that, you need a Td booster dose every 10 years.  HPV immunization. / 3 doses over 6 months, if you are 26 and younger.  Measles, mumps, rubella (MMR) immunization. / You need at least 1 dose of MMR if you were born in 1957 or later. You may also need a second dose.  Meningococcal immunization. / 1 dose if you are age 19 to 21 and a first-year college student living in a residence hall, or have one of several medical conditions, you need to get vaccinated against meningococcal disease. You may also need additional booster doses.  Varicella immunization.** / Consult your caregiver.  Hepatitis A immunization.** / Consult your caregiver. 2 doses, 6 to 18 months apart.  Hepatitis B immunization.** / Consult your caregiver. 3 doses usually over 6 months. Ages 40 to 64  Blood pressure check.** / Every 1 to 2 years.  Lipid and cholesterol check.** / Every 5 years beginning at age 20.  Clinical breast exam.** / Every year after age 40.  Mammogram.** / Every year beginning at age 40   and continuing for as long as you are in good health. Consult with your  caregiver.  Pap test.** / Every 3 years starting at age 30 through age 65 or 70 with a history of 3 consecutive normal Pap tests.  HPV screening.** / Every 3 years from ages 30 through ages 65 to 70 with a history of 3 consecutive normal Pap tests.  Fecal occult blood test (FOBT) of stool. / Every year beginning at age 50 and continuing until age 75. You may not need to do this test if you get a colonoscopy every 10 years.  Flexible sigmoidoscopy or colonoscopy.** / Every 5 years for a flexible sigmoidoscopy or every 10 years for a colonoscopy beginning at age 50 and continuing until age 75.  Hepatitis C blood test.** / For all people born from 1945 through 1965 and any individual with known risks for hepatitis C.  Skin self-exam. / Monthly.  Influenza immunization.** / Every year.  Pneumococcal polysaccharide immunization.** / 1 to 2 doses if you smoke cigarettes or if you have certain chronic medical conditions.  Tetanus, diphtheria, pertussis (Tdap, Td) immunization.** / A one-time dose of Tdap vaccine. After that, you need a Td booster dose every 10 years.  Measles, mumps, rubella (MMR) immunization. / You need at least 1 dose of MMR if you were born in 1957 or later. You may also need a second dose.  Varicella immunization.** / Consult your caregiver.  Meningococcal immunization.** / Consult your caregiver.  Hepatitis A immunization.** / Consult your caregiver. 2 doses, 6 to 18 months apart.  Hepatitis B immunization.** / Consult your caregiver. 3 doses, usually over 6 months. Ages 65 and over  Blood pressure check.** / Every 1 to 2 years.  Lipid and cholesterol check.** / Every 5 years beginning at age 20.  Clinical breast exam.** / Every year after age 40.  Mammogram.** / Every year beginning at age 40 and continuing for as long as you are in good health. Consult with your caregiver.  Pap test.** / Every 3 years starting at age 30 through age 65 or 70 with a 3  consecutive normal Pap tests. Testing can be stopped between 65 and 70 with 3 consecutive normal Pap tests and no abnormal Pap or HPV tests in the past 10 years.  HPV screening.** / Every 3 years from ages 30 through ages 65 or 70 with a history of 3 consecutive normal Pap tests. Testing can be stopped between 65 and 70 with 3 consecutive normal Pap tests and no abnormal Pap or HPV tests in the past 10 years.  Fecal occult blood test (FOBT) of stool. / Every year beginning at age 50 and continuing until age 75. You may not need to do this test if you get a colonoscopy every 10 years.  Flexible sigmoidoscopy or colonoscopy.** / Every 5 years for a flexible sigmoidoscopy or every 10 years for a colonoscopy beginning at age 50 and continuing until age 75.  Hepatitis C blood test.** / For all people born from 1945 through 1965 and any individual with known risks for hepatitis C.  Osteoporosis screening.** / A one-time screening for women ages 65 and over and women at risk for fractures or osteoporosis.  Skin self-exam. / Monthly.  Influenza immunization.** / Every year.  Pneumococcal polysaccharide immunization.** / 1 dose at age 65 (or older) if you have never been vaccinated.  Tetanus, diphtheria, pertussis (Tdap, Td) immunization. / A one-time dose of Tdap vaccine if you are over   65 and have contact with an infant, are a healthcare worker, or simply want to be protected from whooping cough. After that, you need a Td booster dose every 10 years.  Varicella immunization.** / Consult your caregiver.  Meningococcal immunization.** / Consult your caregiver.  Hepatitis A immunization.** / Consult your caregiver. 2 doses, 6 to 18 months apart.  Hepatitis B immunization.** / Check with your caregiver. 3 doses, usually over 6 months. ** Family history and personal history of risk and conditions may change your caregiver's recommendations. Document Released: 08/29/2001 Document Revised: 09/25/2011  Document Reviewed: 11/28/2010 [employer] Patient Information 2013 [employer].  

## 2012-11-04 ENCOUNTER — Other Ambulatory Visit: Payer: Self-pay | Admitting: Family Medicine

## 2012-11-04 ENCOUNTER — Telehealth: Payer: Self-pay | Admitting: General Practice

## 2012-11-04 ENCOUNTER — Encounter: Payer: Self-pay | Admitting: *Deleted

## 2012-11-04 DIAGNOSIS — E785 Hyperlipidemia, unspecified: Secondary | ICD-10-CM

## 2012-11-04 DIAGNOSIS — D509 Iron deficiency anemia, unspecified: Secondary | ICD-10-CM

## 2012-11-04 MED ORDER — SIMVASTATIN 20 MG PO TABS: 20.0000 mg | ORAL_TABLET | Freq: Every day | ORAL | Status: DC

## 2012-11-04 NOTE — Telephone Encounter (Signed)
Medication filled and labs and referral placed. Pt notified and states she has seen Dr. Shirline Frees in the past for her anemia. Stated that he had told her that if anemia had not gotten better she would need IV iron.

## 2012-11-04 NOTE — Telephone Encounter (Signed)
Labs forwarded to Dr.Mohamed     KP

## 2012-11-04 NOTE — Telephone Encounter (Signed)
We can get her back to Dr Shirline Frees if she would like

## 2012-11-04 NOTE — Telephone Encounter (Signed)
Message copied by Jackson Latino on Mon Nov 04, 2012 11:22 AM ------      Message from: Lelon Perla      Created: Sun Nov 03, 2012  2:17 PM       You are anemic---- please take iron supp daily and GI referral is pending      Cholesterol--- LDL goal < 100,  HDL >50,  TG < 150.  Diet and exercise will increase HDL and decrease LDL and TG.  Fish,  Fish Oil, Flaxseed oil will also help increase the HDL and decrease Triglycerides.   Recheck labs in 3 months------ start zocor 20 mg #30 1 po qhs, 2 refills      Dx hyperlipidemia and iron def anemia-----cbcd, ibc , ferritin, hep, lipids.             ------

## 2012-11-18 ENCOUNTER — Telehealth: Payer: Self-pay | Admitting: *Deleted

## 2012-11-18 ENCOUNTER — Ambulatory Visit (AMBULATORY_SURGERY_CENTER): Payer: BC Managed Care – PPO | Admitting: *Deleted

## 2012-11-18 VITALS — Ht 64.5 in | Wt 156.2 lb

## 2012-11-18 DIAGNOSIS — Z1211 Encounter for screening for malignant neoplasm of colon: Secondary | ICD-10-CM

## 2012-11-18 MED ORDER — MOVIPREP 100 G PO SOLR: ORAL | Status: DC

## 2012-11-18 NOTE — Telephone Encounter (Signed)
Dr. Jarold Motto:  Pt scheduled for direct colonoscopy 5/14.  Pt is being followed by Dr. Laury Axon for anemia:  See lab result from 4/16.  Is she okay for direct colonoscopy or does she need ov first?

## 2012-11-19 ENCOUNTER — Encounter: Payer: Self-pay | Admitting: Gastroenterology

## 2012-11-20 NOTE — Telephone Encounter (Signed)
Primary care referral  to do a direct referral is okay with me.  Have not seen this patient and do not want to do chart review.  If no contraindications to colonoscopy ,we need to go ahead and scheduled his painful request

## 2012-11-20 NOTE — Telephone Encounter (Signed)
Pt notified to proceed with colonoscopy as planned.

## 2012-11-27 ENCOUNTER — Encounter: Payer: BC Managed Care – PPO | Admitting: Gastroenterology

## 2012-12-04 ENCOUNTER — Encounter: Payer: Self-pay | Admitting: Gastroenterology

## 2012-12-04 ENCOUNTER — Ambulatory Visit (AMBULATORY_SURGERY_CENTER): Payer: BC Managed Care – PPO | Admitting: Gastroenterology

## 2012-12-04 VITALS — BP 146/89 | HR 63 | Temp 98.8°F | Resp 18 | Ht 64.0 in | Wt 156.0 lb

## 2012-12-04 DIAGNOSIS — Z1211 Encounter for screening for malignant neoplasm of colon: Secondary | ICD-10-CM

## 2012-12-04 DIAGNOSIS — D126 Benign neoplasm of colon, unspecified: Secondary | ICD-10-CM

## 2012-12-04 DIAGNOSIS — D649 Anemia, unspecified: Secondary | ICD-10-CM

## 2012-12-04 MED ORDER — SODIUM CHLORIDE 0.9 % IV SOLN: 500.0000 mL | INTRAVENOUS | Status: DC

## 2012-12-04 NOTE — Op Note (Signed)
[facility] [address] [place], [address]   COLONOSCOPY PROCEDURE REPORT  PATIENT: Jean, Blair  MR#: [id number] BIRTHDATE: October 26, 1960 , 51  yrs. old GENDER: Female ENDOSCOPIST: Mardella Layman, MD, [facility] REFERRED BY:  Loreen Freud, DO PROCEDURE DATE:  12/04/2012 PROCEDURE:   Colonoscopy with snare polypectomy ASA CLASS:   Class I INDICATIONS:Average risk patient for colon cancer. MEDICATIONS: propofol (Diprivan) 400mg  IV  DESCRIPTION OF PROCEDURE:   After the risks and benefits and of the procedure were explained, informed consent was obtained.  A digital rectal exam revealed no abnormalities of the rectum.    The [id number]  endoscope was introduced through the anus and advanced to the cecum, which was identified by both the appendix and ileocecal valve .  The quality of the prep was excellent, using MoviPrep .  The instrument was then slowly withdrawn as the colon was fully examined.     COLON FINDINGS: A polypoid shaped 12 mm pedunculated polyp was found in the sigmoid colon.  A polypectomy was performed using snare cautery.  The resection was complete and the polyp tissue was completely retrieved. a flat 5 mm polyp in the rectosigmoid area also was snare excised.  Otherwise exam the colon was unremarkable without other mucosal polypoid lesions.    Retroflexed views revealed no abnormalities.     The scope was then withdrawn from the patient and the procedure completed.  COMPLICATIONS: There were no complications. ENDOSCOPIC IMPRESSION: Pedunculated polyp was found in the sigmoid colon; polypectomy was performed using snare cautery,smaller flat polyp also removed. These polyps did not appear to be bleeding , ?? cause this patient's hemoglobin of 9.  RECOMMENDATIONS: 1.  Await biopsy results 2.  Repeat Colonoscopy in 5 years. 3  .Endoscopy exam scheduled. REPEAT EXAM:  cc:  _______________________________ eSignedMardella Layman, MD, [facility] 12/04/2012 10:23 AM

## 2012-12-04 NOTE — Progress Notes (Signed)
Called to room to assist during endoscopic procedure.  Patient ID and intended procedure confirmed with present staff. Received instructions for my participation in the procedure from the performing physician.  

## 2012-12-04 NOTE — Progress Notes (Signed)
Patient did not experience any of the following events: a burn prior to discharge; a fall within the facility; wrong site/side/patient/procedure/implant event; or a hospital transfer or hospital admission upon discharge from the facility. (G8907) Patient did not have preoperative order for IV antibiotic SSI prophylaxis. (G8918)  

## 2012-12-04 NOTE — Patient Instructions (Addendum)

## 2012-12-05 ENCOUNTER — Telehealth: Payer: Self-pay

## 2012-12-05 NOTE — Telephone Encounter (Signed)
  Follow up Call-  Call back number 12/04/2012  Post procedure Call Back phone  # [phone]  Permission to leave phone message Yes     Patient questions:  Do you have a fever, pain , or abdominal swelling? no Pain Score  0 *  Have you tolerated food without any problems? yes  Have you been able to return to your normal activities? yes  Do you have any questions about your discharge instructions: Diet   no Medications  no Follow up visit  no  Do you have questions or concerns about your Care? no  Actions: * If pain score is 4 or above: No action needed, pain <4.  Per the pt, "everything is fine". Maw

## 2012-12-06 ENCOUNTER — Ambulatory Visit (AMBULATORY_SURGERY_CENTER): Payer: BC Managed Care – PPO

## 2012-12-06 ENCOUNTER — Encounter: Payer: Self-pay | Admitting: Gastroenterology

## 2012-12-06 VITALS — Ht 64.5 in | Wt 153.0 lb

## 2012-12-06 DIAGNOSIS — R143 Flatulence: Secondary | ICD-10-CM

## 2012-12-06 DIAGNOSIS — D649 Anemia, unspecified: Secondary | ICD-10-CM

## 2012-12-06 DIAGNOSIS — R140 Abdominal distension (gaseous): Secondary | ICD-10-CM

## 2012-12-10 ENCOUNTER — Encounter: Payer: Self-pay | Admitting: Gastroenterology

## 2012-12-11 ENCOUNTER — Encounter: Payer: Self-pay | Admitting: Gastroenterology

## 2012-12-11 ENCOUNTER — Other Ambulatory Visit: Payer: Self-pay | Admitting: *Deleted

## 2012-12-11 ENCOUNTER — Ambulatory Visit (AMBULATORY_SURGERY_CENTER): Payer: BC Managed Care – PPO | Admitting: Gastroenterology

## 2012-12-11 ENCOUNTER — Other Ambulatory Visit (INDEPENDENT_AMBULATORY_CARE_PROVIDER_SITE_OTHER): Payer: BC Managed Care – PPO

## 2012-12-11 VITALS — BP 146/86 | HR 67 | Temp 98.6°F | Resp 18 | Ht 64.0 in | Wt 156.0 lb

## 2012-12-11 DIAGNOSIS — D649 Anemia, unspecified: Secondary | ICD-10-CM

## 2012-12-11 DIAGNOSIS — R143 Flatulence: Secondary | ICD-10-CM

## 2012-12-11 DIAGNOSIS — A048 Other specified bacterial intestinal infections: Secondary | ICD-10-CM

## 2012-12-11 DIAGNOSIS — D1330 Benign neoplasm of unspecified part of small intestine: Secondary | ICD-10-CM

## 2012-12-11 DIAGNOSIS — R141 Gas pain: Secondary | ICD-10-CM

## 2012-12-11 DIAGNOSIS — D509 Iron deficiency anemia, unspecified: Secondary | ICD-10-CM

## 2012-12-11 DIAGNOSIS — IMO0001 Reserved for inherently not codable concepts without codable children: Secondary | ICD-10-CM

## 2012-12-11 DIAGNOSIS — D131 Benign neoplasm of stomach: Secondary | ICD-10-CM

## 2012-12-11 MED ORDER — SODIUM CHLORIDE 0.9 % IV SOLN: 500.0000 mL | INTRAVENOUS | Status: DC

## 2012-12-11 NOTE — Progress Notes (Signed)
Called to room to assist during endoscopic procedure.  Patient ID and intended procedure confirmed with present staff. Received instructions for my participation in the procedure from the performing physician.  

## 2012-12-11 NOTE — Patient Instructions (Addendum)

## 2012-12-11 NOTE — Progress Notes (Signed)
Patient did not experience any of the following events: a burn prior to discharge; a fall within the facility; wrong site/side/patient/procedure/implant event; or a hospital transfer or hospital admission upon discharge from the facility. (G8907)Patient did not have preoperative order for IV antibiotic SSI prophylaxis. [phone])  Pt is complaining from IV stating her hand is numb from insertion of IV, pt states that when the IV was inserted that a sharp shooting pain ran down her hand, pt did not mention any discomfort from IV until removal, pt states hand is still numb even after IV was removed

## 2012-12-11 NOTE — Op Note (Signed)
[facility] [address] [place], [address]   ENDOSCOPY PROCEDURE REPORT  PATIENT: Jean Blair, Jean Blair  MR#: [id number] BIRTHDATE: 1961/04/11 , 51  yrs. old GENDER: Female ENDOSCOPIST:Harshika Mago Hale Bogus, MD, Clementeen Graham REFERRED BY: Loreen Freud, DO PROCEDURE DATE:  12/11/2012 PROCEDURE:   EGD w/ biopsy and EGD w/ biopsy for H.pylori ASA CLASS:    Class II INDICATIONS: Iron deficiency anemia. MEDICATION: propofol (Diprivan) 150mg  IV TOPICAL ANESTHETIC:   Cetacaine Spray  DESCRIPTION OF PROCEDURE:   After the risks and benefits of the procedure were explained, informed consent was obtained.  The LB [id number]  endoscope was introduced through the mouth  and advanced to the second portion of the duodenum .  The instrument was slowly withdrawn as the mucosa was fully examined.      DUODENUM: The duodenal mucosa showed no abnormalities in the bulb and second portion of the duodenum.  Cold forcep biopsies were taken in the second portion.  STOMACH: The mucosa of the stomach appeared normal.   Small sessile polyp was found in the gastric antrum.  Multiple biopsies were performed using cold forceps.  Sample sent for histology.  ESOPHAGUS: The mucosa of the esophagus appeared normal. Retroflexed views revealed no abnormalities.    The scope was then withdrawn from the patient and the procedure completed.  COMPLICATIONS: There were no complications.   ENDOSCOPIC IMPRESSION: 1.   The duodenal mucosa showed no abnormalities in the bulb and second portion of the duodenum ..r/o celiac disease. 2.   The mucosa of the stomach appeared normal 3.   Sessile polyp was found in the gastric antrum; multiple biopsies 4.   The mucosa of the esophagus appeared normal  RECOMMENDATIONS: 1.  Await pathology results 2.  Continue current meds 3.  we'll check iron levels today and schedule iron infusion.  I suspect her anemia is from menorrhagia that she continues to  have despite age 23.  Her GI workup has been fairly unremarkable except for colon polyp that was removed.  This did not explain her anemia.    _______________________________ eSigned:  Mardella Layman, MD, [facility] 12/11/2012 2:46 PM      PATIENT NAME:  [name], [name] MR#: [id number]

## 2012-12-12 ENCOUNTER — Telehealth: Payer: Self-pay | Admitting: *Deleted

## 2012-12-12 LAB — IRON AND TIBC: Iron: <10 ug/dL — ABNORMAL LOW (ref 42–145)

## 2012-12-12 NOTE — Telephone Encounter (Signed)
  Follow up Call-  Call back number 12/11/2012 12/04/2012  Post procedure Call Back phone  # [phone]  Permission to leave phone message Yes Yes     Patient questions:  Do you have a fever, pain , or abdominal swelling? no Pain Score  0 *  Have you tolerated food without any problems? Yes   Have you been able to return to your normal activities? yes  Do you have any questions about your discharge instructions: Diet   no Medications  no Follow up visit  no  Do you have questions or concerns about your Care? no  Actions: * If pain score is 4 or above: No action needed, pain <4.

## 2012-12-13 ENCOUNTER — Telehealth: Payer: Self-pay | Admitting: *Deleted

## 2012-12-13 ENCOUNTER — Other Ambulatory Visit: Payer: Self-pay | Admitting: *Deleted

## 2012-12-13 DIAGNOSIS — D509 Iron deficiency anemia, unspecified: Secondary | ICD-10-CM

## 2012-12-13 MED ORDER — SODIUM CHLORIDE 0.9 % IV SOLN: 1020.0000 mg | Freq: Once | INTRAVENOUS | Status: DC

## 2012-12-13 NOTE — Telephone Encounter (Signed)
Informed pt of the need for IV Iron. She will receive IV Feraheme at [facility] on 12/17/12. Pt stated understanding.

## 2012-12-13 NOTE — Telephone Encounter (Signed)
Message copied by Florene Glen on Fri Dec 13, 2012  9:57 AM ------      Message from: Jarold Motto, DAVID R      Created: Thu Dec 12, 2012  8:04 AM       Please arrange for her to have an IV iron infusion at [facility] long outpatient ------

## 2012-12-16 ENCOUNTER — Encounter: Payer: Self-pay | Admitting: Gastroenterology

## 2012-12-17 ENCOUNTER — Other Ambulatory Visit (HOSPITAL_COMMUNITY): Payer: Self-pay | Admitting: Gastroenterology

## 2012-12-17 ENCOUNTER — Encounter (HOSPITAL_COMMUNITY)
Admission: RE | Admit: 2012-12-17 | Discharge: 2012-12-17 | Disposition: A | Discharge status: Home / Self Care | Payer: BC Managed Care – PPO | Source: Ambulatory Visit | Attending: Gastroenterology | Admitting: Gastroenterology

## 2012-12-17 ENCOUNTER — Encounter (HOSPITAL_COMMUNITY): Payer: Self-pay

## 2012-12-17 ENCOUNTER — Telehealth: Payer: Self-pay | Admitting: *Deleted

## 2012-12-17 VITALS — BP 131/76 | HR 58 | Temp 97.3°F | Resp 18

## 2012-12-17 DIAGNOSIS — D509 Iron deficiency anemia, unspecified: Secondary | ICD-10-CM | POA: Insufficient documentation

## 2012-12-17 MED ORDER — SODIUM CHLORIDE 0.9 % IV SOLN
Freq: Once | INTRAVENOUS | Status: AC
  Administered 2012-12-17: 11:00:00 via INTRAVENOUS

## 2012-12-17 MED ORDER — SODIUM CHLORIDE 0.9 % IV SOLN
1020.0000 mg | Freq: Once | INTRAVENOUS | Status: AC
  Administered 2012-12-17: 1020 mg via INTRAVENOUS
  Filled 2012-12-17: qty 34

## 2012-12-17 NOTE — Telephone Encounter (Signed)
lmom for pt to call back. Per Dr Jarold Motto, pt + for H. Pylori; please order a Prevpak for 10 days.

## 2012-12-19 ENCOUNTER — Encounter: Payer: Self-pay | Admitting: Family Medicine

## 2012-12-19 ENCOUNTER — Ambulatory Visit (INDEPENDENT_AMBULATORY_CARE_PROVIDER_SITE_OTHER): Payer: BC Managed Care – PPO | Admitting: Family Medicine

## 2012-12-19 VITALS — BP 130/74 | HR 75 | Temp 99.0°F | Wt 152.6 lb

## 2012-12-19 DIAGNOSIS — L918 Other hypertrophic disorders of the skin: Secondary | ICD-10-CM

## 2012-12-19 DIAGNOSIS — D509 Iron deficiency anemia, unspecified: Secondary | ICD-10-CM

## 2012-12-19 DIAGNOSIS — L909 Atrophic disorder of skin, unspecified: Secondary | ICD-10-CM

## 2012-12-19 MED ORDER — AMOXICILL-CLARITHRO-LANSOPRAZ PO MISC: Freq: Two times a day (BID) | ORAL | Status: DC

## 2012-12-19 MED ORDER — SIMVASTATIN 20 MG PO TABS: 20.0000 mg | ORAL_TABLET | Freq: Every day | ORAL | Status: DC

## 2012-12-19 NOTE — Patient Instructions (Signed)
Keep area dry 24 h then you may shower as usual

## 2012-12-19 NOTE — Progress Notes (Signed)
  Skin Tag Removal Procedure Note  Pre-operative Diagnosis: Classic skin tags (acrochordon)  Post-operative Diagnosis: Classic skin tags (acrochordon)  Locations:lateral neck  Indications: irritated  Anesthesia: zylocaine  without added sodium bicarbonate  Procedure Details  The risks (including bleeding and infection) and benefits of the procedure and Written informed consent obtained. Using sterile iris scissors, multiple skin tags were snipped off at their bases after cleansing with Betadine.  Bleeding was controlled by pressure and silver nitrate Findings: Pathognomonic benign lesions x7 not sent for pathological exam.  Condition: Stable  Complications: none.  Plan: 1. Instructed to keep the wounds dry and covered for 24-48h and clean thereafter. 2. Warning signs of infection were reviewed.   3. Recommended that the patient use na as needed for pain.  4. Return as needed.

## 2012-12-19 NOTE — Telephone Encounter (Signed)
Spoke with pt to informed her of the need for AB. She is on her way out the door to see Dr Laury Axon and she will ask her about it. PrevPak ordered.

## 2012-12-20 ENCOUNTER — Telehealth: Payer: Self-pay | Admitting: *Deleted

## 2012-12-20 NOTE — Telephone Encounter (Signed)
Instructed pt to hold simvastatin while on the PrevPak d/t a possible interaction; pt stated understanding.

## 2012-12-20 NOTE — Telephone Encounter (Signed)
Message copied by Florene Glen on Fri Dec 20, 2012  8:10 AM ------      Message from: Lelon Perla      Created: Thu Dec 19, 2012  4:35 PM       Yes she can----just have her start back on it when she is done---thanks      ----- Message -----         From: Linna Hoff, RN         Sent: 12/19/2012   2:52 PM           To: Lelon Perla, DO            Dr Jarold Motto ordered Prev Pack for + H. Pylori and there is an increased risk of toxicity with statins; can pt hold her statins while under treatment? Thanks. Graciella Freer, RN for Dr Sheryn Bison ext [phone]       ------

## 2012-12-27 ENCOUNTER — Encounter: Payer: Self-pay | Admitting: Family Medicine

## 2012-12-31 ENCOUNTER — Encounter: Payer: Self-pay | Admitting: Family Medicine

## 2013-06-12 ENCOUNTER — Other Ambulatory Visit: Payer: Self-pay | Admitting: Family Medicine

## 2013-06-13 NOTE — Telephone Encounter (Signed)
Last seen 12/19/12 and filled 10/30/12 #30 with 3 refills. Please advise      KP

## 2014-01-27 ENCOUNTER — Ambulatory Visit: Payer: BC Managed Care – PPO | Admitting: Family Medicine

## 2014-02-02 ENCOUNTER — Ambulatory Visit: Payer: BC Managed Care – PPO | Admitting: Family Medicine

## 2014-02-04 ENCOUNTER — Ambulatory Visit: Payer: BC Managed Care – PPO | Admitting: Family Medicine

## 2014-02-06 ENCOUNTER — Ambulatory Visit: Payer: BC Managed Care – PPO | Admitting: Family Medicine

## 2014-02-10 ENCOUNTER — Ambulatory Visit (INDEPENDENT_AMBULATORY_CARE_PROVIDER_SITE_OTHER): Payer: BC Managed Care – PPO | Admitting: Family Medicine

## 2014-02-10 ENCOUNTER — Other Ambulatory Visit (HOSPITAL_COMMUNITY)
Admission: RE | Admit: 2014-02-10 | Discharge: 2014-02-10 | Disposition: A | Discharge status: Home / Self Care | Payer: BC Managed Care – PPO | Source: Ambulatory Visit | Attending: Family Medicine | Admitting: Family Medicine

## 2014-02-10 ENCOUNTER — Encounter: Payer: Self-pay | Admitting: Family Medicine

## 2014-02-10 VITALS — BP 116/76 | HR 73 | Temp 98.3°F | Wt 158.0 lb

## 2014-02-10 DIAGNOSIS — N760 Acute vaginitis: Secondary | ICD-10-CM | POA: Insufficient documentation

## 2014-02-10 DIAGNOSIS — E663 Overweight: Secondary | ICD-10-CM

## 2014-02-10 DIAGNOSIS — F411 Generalized anxiety disorder: Secondary | ICD-10-CM

## 2014-02-10 DIAGNOSIS — R319 Hematuria, unspecified: Secondary | ICD-10-CM

## 2014-02-10 DIAGNOSIS — N898 Other specified noninflammatory disorders of vagina: Secondary | ICD-10-CM

## 2014-02-10 DIAGNOSIS — Z8742 Personal history of other diseases of the female genital tract: Secondary | ICD-10-CM

## 2014-02-10 DIAGNOSIS — F419 Anxiety disorder, unspecified: Secondary | ICD-10-CM

## 2014-02-10 DIAGNOSIS — R82998 Other abnormal findings in urine: Secondary | ICD-10-CM

## 2014-02-10 LAB — POCT URINALYSIS DIPSTICK
Bilirubin, UA: NEGATIVE
Glucose, UA: NEGATIVE
Ketones, UA: NEGATIVE
NITRITE UA: NEGATIVE
PH UA: 6
Protein, UA: NEGATIVE
Spec Grav, UA: <=1.005
UROBILINOGEN UA: 0.2

## 2014-02-10 MED ORDER — PHENTERMINE HCL 37.5 MG PO TABS: ORAL_TABLET | ORAL | Status: DC

## 2014-02-10 MED ORDER — ALPRAZOLAM 0.5 MG PO TABS: ORAL_TABLET | ORAL | Status: DC

## 2014-02-10 NOTE — Patient Instructions (Signed)

## 2014-02-10 NOTE — Progress Notes (Signed)
  Subjective:    Jean Blair is a 53 y.o. female who presents for sexually transmitted disease check. Sexual history reviewed with the patient. STI Exposure: denies knowledge of risky exposure. Previous history of STI none. Current symptoms vaginal discharge: white, thick and malodorous, no pain. Contraception: none Menstrual History: OB History   Grav Para Term Preterm Abortions TAB SAB Ect Mult Living                   Patient's last menstrual period was 01/24/2014.    The following portions of the patient's history were reviewed and updated as appropriate: allergies, current medications, past family history, past medical history, past social history, past surgical history and problem list.  Review of Systems Pertinent items are noted in HPI.    Objective:    BP 116/76  Pulse 73  Temp(Src) 98.3 F (36.8 C) (Oral)  Wt 158 lb (71.668 kg)  SpO2 99%  LMP 01/24/2014 General:   alert, cooperative, appears stated age and no distress  Lymph Nodes:   Cervical, supraclavicular, and axillary nodes normal.  Pelvis:  Vulva and vagina appear normal. Bimanual exam reveals normal uterus and adnexa. Vaginal: discharge, white Clinical staff offered to be present for exam: yes  Initials: JT  Cultures:  wet prep     Assessment:    Very low risk of STD exposure    Plan:    Appropriate educational material was distributed See orders. Will call pt with results RTC PRN   1. History of heavy periods Pt requesting referral - Ambulatory referral to Gynecology  2. Anxiety Refill meds, stable - ALPRAZolam (XANAX) 0.5 MG tablet; TAKE 1 TABLET BY MOUTH EVERY DAY AS NEEDED  Dispense: 30 tablet; Refill: 0  3. Overweight (BMI 25.0-29.9) con't diet and exercise - phentermine (ADIPEX-P) 37.5 MG tablet; 1 po qam  Dispense: 30 tablet; Refill: 0  4. Vaginal discharge   - POCT urinalysis dipstick - Cervicovaginal ancillary only  5. Hematuria   - Urine culture; Future - Urine  culture  6. Leukocytes in urine   - Urine culture; Future - Urine culture

## 2014-02-10 NOTE — Progress Notes (Signed)
Pre visit review using our clinic review tool, if applicable. No additional management support is needed unless otherwise documented below in the visit note. 

## 2014-02-13 ENCOUNTER — Other Ambulatory Visit: Payer: Self-pay

## 2014-02-13 MED ORDER — METRONIDAZOLE 0.75 % VA GEL: 1.0000 | Freq: Every day | VAGINAL | Status: DC

## 2014-02-13 NOTE — Progress Notes (Signed)
Patient has been made aware and has requested the vaginal cream instead. Ok per [employer], Rx sent     KP

## 2014-02-14 LAB — URINE CULTURE: Colony Count: >=100000

## 2014-02-16 ENCOUNTER — Other Ambulatory Visit: Payer: Self-pay

## 2014-02-23 ENCOUNTER — Telehealth: Payer: Self-pay

## 2014-02-23 MED ORDER — AMOXICILLIN 875 MG PO TABS: 875.0000 mg | ORAL_TABLET | Freq: Two times a day (BID) | ORAL | Status: DC

## 2014-02-23 NOTE — Telephone Encounter (Signed)
Patient apologized for not calling sooner, [place] was [place]. I made her aware of her results and she voiced understanding. Rx for Amoxicillin 875 sent to the pharmacy.     KP

## 2014-02-23 NOTE — Telephone Encounter (Signed)
Notes Recorded by Rosalita Chessman, DO on 02/16/2014 at 9:17 AM + uti-- amoxicillin 875 1 po bid x 10 days

## 2014-02-23 NOTE — Telephone Encounter (Signed)
Jalan returned your call, she said you had called her twice while she was on vacation. Call her back at your convinence

## 2014-03-05 ENCOUNTER — Other Ambulatory Visit: Payer: Self-pay | Admitting: Obstetrics and Gynecology

## 2014-03-06 LAB — CYTOLOGY - PAP

## 2015-07-09 ENCOUNTER — Emergency Department (HOSPITAL_COMMUNITY): Payer: Managed Care, Other (non HMO)

## 2015-07-09 ENCOUNTER — Telehealth: Payer: Self-pay | Admitting: Family Medicine

## 2015-07-09 ENCOUNTER — Encounter (HOSPITAL_COMMUNITY): Payer: Self-pay

## 2015-07-09 ENCOUNTER — Emergency Department (HOSPITAL_COMMUNITY)
Admission: EM | Admit: 2015-07-09 | Discharge: 2015-07-09 | Disposition: A | Discharge status: Home / Self Care | Payer: Managed Care, Other (non HMO) | Attending: Emergency Medicine | Admitting: Emergency Medicine

## 2015-07-09 DIAGNOSIS — I10 Essential (primary) hypertension: Secondary | ICD-10-CM | POA: Diagnosis not present

## 2015-07-09 DIAGNOSIS — Z862 Personal history of diseases of the blood and blood-forming organs and certain disorders involving the immune mechanism: Secondary | ICD-10-CM | POA: Insufficient documentation

## 2015-07-09 DIAGNOSIS — Z3202 Encounter for pregnancy test, result negative: Secondary | ICD-10-CM | POA: Insufficient documentation

## 2015-07-09 DIAGNOSIS — Z8639 Personal history of other endocrine, nutritional and metabolic disease: Secondary | ICD-10-CM | POA: Diagnosis not present

## 2015-07-09 DIAGNOSIS — R0789 Other chest pain: Secondary | ICD-10-CM | POA: Diagnosis not present

## 2015-07-09 DIAGNOSIS — Z792 Long term (current) use of antibiotics: Secondary | ICD-10-CM | POA: Diagnosis not present

## 2015-07-09 DIAGNOSIS — Z8669 Personal history of other diseases of the nervous system and sense organs: Secondary | ICD-10-CM | POA: Insufficient documentation

## 2015-07-09 DIAGNOSIS — F419 Anxiety disorder, unspecified: Secondary | ICD-10-CM | POA: Insufficient documentation

## 2015-07-09 DIAGNOSIS — Z87891 Personal history of nicotine dependence: Secondary | ICD-10-CM | POA: Diagnosis not present

## 2015-07-09 DIAGNOSIS — R079 Chest pain, unspecified: Secondary | ICD-10-CM | POA: Diagnosis present

## 2015-07-09 LAB — CBC
HEMATOCRIT: 35.3 % — AB (ref 36.0–46.0)
Hemoglobin: 11.1 g/dL — ABNORMAL LOW (ref 12.0–15.0)
MCH: 25.9 pg — ABNORMAL LOW (ref 26.0–34.0)
MCHC: 31.4 g/dL (ref 30.0–36.0)
MCV: 82.3 fL (ref 78.0–100.0)
Platelets: 385 10*3/uL (ref 150–400)
RBC: 4.29 MIL/uL (ref 3.87–5.11)
RDW: 14.8 % (ref 11.5–15.5)
WBC: 7 10*3/uL (ref 4.0–10.5)

## 2015-07-09 LAB — BASIC METABOLIC PANEL
Anion gap: 8 (ref 5–15)
BUN: 12 mg/dL (ref 6–20)
CHLORIDE: 106 mmol/L (ref 101–111)
CO2: 27 mmol/L (ref 22–32)
Calcium: 9.6 mg/dL (ref 8.9–10.3)
Creatinine, Ser: 1.05 mg/dL — ABNORMAL HIGH (ref 0.44–1.00)
GFR calc Af Amer: >60 mL/min (ref >60)
GFR calc non Af Amer: 60 mL/min — ABNORMAL LOW (ref >60)
GLUCOSE: 97 mg/dL (ref 65–99)
POTASSIUM: 4 mmol/L (ref 3.5–5.1)
SODIUM: 141 mmol/L (ref 135–145)

## 2015-07-09 LAB — I-STAT BETA HCG BLOOD, ED (MC, WL, AP ONLY)

## 2015-07-09 LAB — I-STAT TROPONIN, ED: Troponin i, poc: 0 ng/mL (ref 0.00–0.08)

## 2015-07-09 MED ORDER — HYDROCHLOROTHIAZIDE 12.5 MG PO TABS: 12.5000 mg | ORAL_TABLET | Freq: Every day | ORAL | Status: DC

## 2015-07-09 NOTE — Telephone Encounter (Signed)
Felicia from [facility] called to report that patient refused to call EMS or go to ER with complaints of chest pressure x 5 mins and palpitations x several days.  Felicia states that patient [facility] [address], but did not want to go to ER.  Mobile number was given--[phone].    Called to follow up with patient.  She says that she was on her way to [facility] now.  No further needs voiced at this time.

## 2015-07-09 NOTE — ED Provider Notes (Signed)
CSN: [id number]     Arrival date & time 07/09/15  1539 History   First MD Initiated Contact with Patient 07/09/15 1600     Chief Complaint  Patient presents with  . Altered Mental Status  . Chest Pain     (Consider location/radiation/quality/duration/timing/severity/associated sxs/prior Treatment) The history is provided by the patient.  Jean Blair is a 54 y.o. female hx of anxiety, HL, here presenting with "fogginess in her head", chest heaviness. Patient states that her head feels foggy for the last 3 weeks. She has intermittent headaches for the last 3 weeks. She denies being confused or vomiting or nausea or fevers or neck pain. Today, she felt that her head is more foggy than usual. She has some chest heaviness for the last 3 weeks that is unchanged. Denies any shortness of breath or leg swelling. She has a history of anxiety but states that she has not been stressed out recently.    Past Medical History  Diagnosis Date  . Anxiety   . Anemia   . Hyperlipidemia   . Cataract    Past Surgical History  Procedure Laterality Date  . Tubal ligation  1991  . Colonoscopy     Family History  Problem Relation Age of Onset  . Anxiety disorder    . Diabetes    . Hypertension    . Mental illness    . Alcohol abuse    . Stroke    . Colon cancer Neg Hx   . Diabetes Mother   . Heart disease Maternal Grandfather    Social History  Substance Use Topics  . Smoking status: Former Smoker    Types: Cigarettes    Quit date: 11/18/2000  . Smokeless tobacco: Never Used  . Alcohol Use: 4.2 oz/week    7 Glasses of wine per week   OB History    No data available     Review of Systems  Cardiovascular: Positive for chest pain.  Neurological: Positive for headaches.  All other systems reviewed and are negative.     Allergies  Codeine  Home Medications   Prior to Admission medications   Medication Sig Start Date End Date Taking? Authorizing Provider  ALPRAZolam Duanne Moron)  0.5 MG tablet TAKE 1 TABLET BY MOUTH EVERY DAY AS NEEDED 02/10/14   Rosalita Chessman, DO  amoxicillin (AMOXIL) 875 MG tablet Take 1 tablet (875 mg total) by mouth 2 (two) times daily. 02/23/14   Rosalita Chessman, DO  metroNIDAZOLE (METROGEL VAGINAL) 0.75 % vaginal gel Place 1 Applicatorful vaginally at bedtime. x's 5 nights 02/13/14   Rosalita Chessman, DO  phentermine (ADIPEX-P) 37.5 MG tablet 1 po qam 02/10/14   Yvonne R Lowne, DO   BP 165/98 mmHg  Pulse 61  Temp(Src) 98 F (36.7 C) (Oral)  Resp 20  SpO2 100% Physical Exam  Constitutional: She is oriented to person, place, and time. She appears well-developed and well-nourished.  Anxious   HENT:  Head: Normocephalic.  Mouth/Throat: Oropharynx is clear and moist.  Eyes: Conjunctivae are normal. Pupils are equal, round, and reactive to light.  Neck: Normal range of motion. Neck supple.  Cardiovascular: Normal rate, regular rhythm and normal heart sounds.   Pulmonary/Chest: Effort normal and breath sounds normal. No respiratory distress. She has no wheezes. She has no rales.  Abdominal: Soft. Bowel sounds are normal. She exhibits no distension. There is no tenderness. There is no rebound.  Musculoskeletal: Normal range of motion. She exhibits no edema or  tenderness.  Neurological: She is alert and oriented to person, place, and time.  CN 2-12 intact. Nl finger to nose. Nl strength throughout.   Skin: Skin is warm and dry.  Psychiatric: She has a normal mood and affect. Her behavior is normal. Judgment and thought content normal.  Nursing note and vitals reviewed.   ED Course  Procedures (including critical care time) Labs Review Labs Reviewed  BASIC METABOLIC PANEL - Abnormal; Notable for the following:    Creatinine, Ser 1.05 (*)    GFR calc non Af Amer 60 (*)    All other components within normal limits  CBC - Abnormal; Notable for the following:    Hemoglobin 11.1 (*)    HCT 35.3 (*)    MCH 25.9 (*)    All other components within  normal limits  I-STAT TROPOININ, ED  I-STAT BETA HCG BLOOD, ED ([facility], [facility], [facility] ONLY)    Imaging Review Dg Chest 2 View  07/09/2015  CLINICAL DATA:  Onset of left-sided chest pain today, no previous history of cardiopulmonary problems ; discontinued smoking 13 years ago. EXAM: CHEST  2 VIEW COMPARISON:  PA and lateral chest x-ray of Dec 12, 2006 FINDINGS: The lungs are adequately inflated and clear. The heart and pulmonary vascularity are normal. The mediastinum is normal in width. There is no pleural effusion or pneumothorax. The heart and pulmonary vascularity are normal. The bony thorax exhibits no acute abnormality. IMPRESSION: There is no active cardiopulmonary disease. Electronically Signed   By: David  Martinique M.D.   On: 07/09/2015 16:04   Ct Head Wo Contrast  07/09/2015  CLINICAL DATA:  Altered mental status and fogginess for 3 weeks. EXAM: CT HEAD WITHOUT CONTRAST TECHNIQUE: Contiguous axial images were obtained from the base of the skull through the vertex without intravenous contrast. COMPARISON:  Dec 12, 2006 FINDINGS: There is no midline shift hydrocephalus or mass. No acute hemorrhage or acute transcortical infarct is identified. The bony calvarium is intact. There is mild mucoperiosteal thickening of bilateral ethmoid sinuses. IMPRESSION: No focal acute intracranial abnormality identified. Electronically Signed   By: Abelardo Diesel M.D.   On: 07/09/2015 18:18   I have personally reviewed and evaluated these images and lab results as part of my medical decision-making.   EKG Interpretation   Date/Time:  Friday July 09 2015 15:47:20 EST Ventricular Rate:  79 PR Interval:  140 QRS Duration: 94 QT Interval:  370 QTC Calculation: 424 R Axis:   55 Text Interpretation:  Sinus rhythm No significant change since last  tracing Confirmed by YAO  MD, DAVID ([id number]) on 07/09/2015 4:25:42 PM      MDM   Final diagnoses:  None    Jean Blair is a 54 y.o. female here with  "fogginess in her head", intermittent chest pain. A &O x 3, nl neuro exam. Will check labs, CT head. Chest pain for 3 weeks so trop x 1 sufficient.   6:30 PM CT head nl, labs unremarkable. She was hypertensive 180s in the ED on arrival and came down to 165/98 on dc. I wonder if she has symptomatic hypertension. No signs of hypertensive emergency. Will start low dose HCTZ. Will have her f/u with PCP in a week to recheck BP.    Wandra Arthurs, MD 07/09/15 [phone]

## 2015-07-09 NOTE — ED Notes (Signed)
Pt c/o "fogginess" and intermittent L side chest heaviness x 3 weeks.  Pain score 3/10.  Denies n/v/d and radiation of pain.  Hx of anxiety and smoking.

## 2015-07-09 NOTE — Discharge Instructions (Signed)
Take hctz as prescribed for elevated blood pressure.   See your doctor in a week to recheck blood pressure.   Return to ER if you have worse headaches, vomiting, chest pain.

## 2015-07-09 NOTE — Telephone Encounter (Signed)
[facility] Day - Client TELEPHONE ADVICE RECORD [facility] Patient Name: Jean Blair DOB: 05-28-1961 Initial Comment Caller states feels foggy, heart races when she lays down. Has been on and off for a few weeks. Nurse Assessment Nurse: Martyn Ehrich, RN, Felicia Date/Time (Eastern Time): 07/09/2015 3:01:51 PM Confirm and document reason for call. If symptomatic, describe symptoms. ---Pt has been foggy and heart racing when she lies down on and off for a few days. BP at [facility] MD was a little high - but he didn't think it was extreme. Told her if BP is above 140/90 call back. She said she can check it at drug store. Has the patient traveled out of the country within the last 30 days? ---No Does the patient have any new or worsening symptoms? ---Yes Will a triage be completed? ---YesRelated visit to physician within the last 2 weeks? ---No Does the PT have any chronic conditions? (i.e. diabetes, asthma, etc.) ---No Did the patient indicate they were pregnant? ---No Is this a behavioral health or substance abuse call? ---No Guidelines Guideline Title Affirmed Question Affirmed Notes Chest Pain [1] Chest pain lasts > 5 minutes AND [2] described as crushing, pressure-like, or heavy Final Disposition User Call EMS 911 Now [place], RN, Felicia Comments onthe phone on hold with office to report pt's decline to call 911 - she has this cell no. with her. - will send report to office then add note before faxing Referrals Sandy REFUSED Disagree/Comply: Disagree Disagree/Comply Reason: Disagree with instructions Call Id: [id number]

## 2015-07-15 ENCOUNTER — Emergency Department (HOSPITAL_BASED_OUTPATIENT_CLINIC_OR_DEPARTMENT_OTHER)
Admission: EM | Admit: 2015-07-15 | Discharge: 2015-07-15 | Disposition: A | Discharge status: Home / Self Care | Payer: Managed Care, Other (non HMO) | Attending: Emergency Medicine | Admitting: Emergency Medicine

## 2015-07-15 ENCOUNTER — Encounter (HOSPITAL_BASED_OUTPATIENT_CLINIC_OR_DEPARTMENT_OTHER): Payer: Self-pay | Admitting: *Deleted

## 2015-07-15 ENCOUNTER — Emergency Department (HOSPITAL_BASED_OUTPATIENT_CLINIC_OR_DEPARTMENT_OTHER): Payer: Managed Care, Other (non HMO)

## 2015-07-15 ENCOUNTER — Encounter: Payer: Self-pay | Admitting: Medical

## 2015-07-15 ENCOUNTER — Ambulatory Visit (INDEPENDENT_AMBULATORY_CARE_PROVIDER_SITE_OTHER): Payer: Managed Care, Other (non HMO) | Admitting: Medical

## 2015-07-15 VITALS — BP 130/80 | HR 65 | Temp 98.3°F | Ht 64.5 in | Wt 169.0 lb

## 2015-07-15 DIAGNOSIS — Z8669 Personal history of other diseases of the nervous system and sense organs: Secondary | ICD-10-CM | POA: Diagnosis not present

## 2015-07-15 DIAGNOSIS — R0789 Other chest pain: Secondary | ICD-10-CM

## 2015-07-15 DIAGNOSIS — F419 Anxiety disorder, unspecified: Secondary | ICD-10-CM | POA: Diagnosis not present

## 2015-07-15 DIAGNOSIS — I10 Essential (primary) hypertension: Secondary | ICD-10-CM | POA: Insufficient documentation

## 2015-07-15 DIAGNOSIS — R030 Elevated blood-pressure reading, without diagnosis of hypertension: Secondary | ICD-10-CM

## 2015-07-15 DIAGNOSIS — R079 Chest pain, unspecified: Secondary | ICD-10-CM | POA: Diagnosis present

## 2015-07-15 DIAGNOSIS — Z862 Personal history of diseases of the blood and blood-forming organs and certain disorders involving the immune mechanism: Secondary | ICD-10-CM | POA: Insufficient documentation

## 2015-07-15 DIAGNOSIS — Z8639 Personal history of other endocrine, nutritional and metabolic disease: Secondary | ICD-10-CM | POA: Diagnosis not present

## 2015-07-15 DIAGNOSIS — Z87891 Personal history of nicotine dependence: Secondary | ICD-10-CM | POA: Insufficient documentation

## 2015-07-15 DIAGNOSIS — IMO0001 Reserved for inherently not codable concepts without codable children: Secondary | ICD-10-CM

## 2015-07-15 LAB — CBC WITH DIFFERENTIAL/PLATELET
BASOS ABS: 0 10*3/uL (ref 0.0–0.1)
Basophils Relative: 0 %
Eosinophils Absolute: 0.1 10*3/uL (ref 0.0–0.7)
Eosinophils Relative: 2 %
HCT: 36.9 % (ref 36.0–46.0)
HEMOGLOBIN: 11.9 g/dL — AB (ref 12.0–15.0)
LYMPHS ABS: 2.4 10*3/uL (ref 0.7–4.0)
LYMPHS PCT: 32 %
MCH: 26.4 pg (ref 26.0–34.0)
MCHC: 32.2 g/dL (ref 30.0–36.0)
MCV: 82 fL (ref 78.0–100.0)
Monocytes Absolute: 0.6 10*3/uL (ref 0.1–1.0)
Monocytes Relative: 8 %
NEUTROS ABS: 4.3 10*3/uL (ref 1.7–7.7)
NEUTROS PCT: 58 %
Platelets: 364 10*3/uL (ref 150–400)
RBC: 4.5 MIL/uL (ref 3.87–5.11)
RDW: 15.6 % — ABNORMAL HIGH (ref 11.5–15.5)
WBC: 7.5 10*3/uL (ref 4.0–10.5)

## 2015-07-15 LAB — BASIC METABOLIC PANEL
ANION GAP: 6 (ref 5–15)
BUN: 11 mg/dL (ref 6–20)
CO2: 25 mmol/L (ref 22–32)
Calcium: 9.5 mg/dL (ref 8.9–10.3)
Chloride: 105 mmol/L (ref 101–111)
Creatinine, Ser: 0.84 mg/dL (ref 0.44–1.00)
GFR calc non Af Amer: >60 mL/min (ref >60)
Glucose, Bld: 103 mg/dL — ABNORMAL HIGH (ref 65–99)
Potassium: 4 mmol/L (ref 3.5–5.1)
Sodium: 136 mmol/L (ref 135–145)

## 2015-07-15 LAB — TROPONIN I

## 2015-07-15 LAB — D-DIMER, QUANTITATIVE: D-Dimer, Quant: <0.27 ug/mL-FEU (ref 0.00–0.50)

## 2015-07-15 MED ORDER — OMEPRAZOLE 20 MG PO CPDR: 20.0000 mg | DELAYED_RELEASE_CAPSULE | Freq: Every day | ORAL | Status: DC

## 2015-07-15 NOTE — ED Notes (Signed)
She was seen for chest pain in the ED a week ago. She was seen by her MD today and had a repeat EKG. The doctor did not like how it looked and sent her to the ED for further evaluation. EKG repeated on arrival to the ED.

## 2015-07-15 NOTE — Discharge Instructions (Signed)
Chest Wall Pain Chest wall pain is pain in or around the bones and muscles of your chest. Sometimes, an injury causes this pain. Sometimes, the cause may not be known. This pain may take several weeks or longer to get better. HOME CARE INSTRUCTIONS  Pay attention to any changes in your symptoms. Take these actions to help with your pain:   Rest as told by your health care provider.   Avoid activities that cause pain. These include any activities that use your chest muscles or your abdominal and side muscles to lift heavy items.   If directed, apply ice to the painful area:  Put ice in a plastic bag.  Place a towel between your skin and the bag.  Leave the ice on for 20 minutes, 2-3 times per day.  Take over-the-counter and prescription medicines only as told by your health care provider.  Do not use tobacco products, including cigarettes, chewing tobacco, and e-cigarettes. If you need help quitting, ask your health care provider.  Keep all follow-up visits as told by your health care provider. This is important. SEEK MEDICAL CARE IF:  You have a fever.  Your chest pain becomes worse.  You have new symptoms. SEEK IMMEDIATE MEDICAL CARE IF:  You have nausea or vomiting.  You feel sweaty or light-headed.  You have a cough with phlegm (sputum) or you cough up blood.  You develop shortness of breath.   This information is not intended to replace advice given to you by your health care provider. Make sure you discuss any questions you have with your health care provider.   Document Released: 07/03/2005 Document Revised: 03/24/2015 Document Reviewed: 09/28/2014 Elsevier Interactive Patient Education 2016 [employer].  Hypertension Hypertension, commonly called high blood pressure, is when the force of blood pumping through your arteries is too strong. Your arteries are the blood vessels that carry blood from your heart throughout your body. A blood pressure reading consists  of a higher number over a lower number, such as 110/72. The higher number (systolic) is the pressure inside your arteries when your heart pumps. The lower number (diastolic) is the pressure inside your arteries when your heart relaxes. Ideally you want your blood pressure below 120/80. Hypertension forces your heart to work harder to pump blood. Your arteries may become narrow or stiff. Having untreated or uncontrolled hypertension can cause heart attack, stroke, kidney disease, and other problems. RISK FACTORS Some risk factors for high blood pressure are controllable. Others are not.  Risk factors you cannot control include:   Race. You may be at higher risk if you are African American.  Age. Risk increases with age.  Gender. Men are at higher risk than women before age 14 years. After age 77, women are at higher risk than men. Risk factors you can control include:  Not getting enough exercise or physical activity.  Being overweight.  Getting too much fat, sugar, calories, or salt in your diet.  Drinking too much alcohol. SIGNS AND SYMPTOMS Hypertension does not usually cause signs or symptoms. Extremely high blood pressure (hypertensive crisis) may cause headache, anxiety, shortness of breath, and nosebleed. DIAGNOSIS To check if you have hypertension, your health care provider will measure your blood pressure while you are seated, with your arm held at the level of your heart. It should be measured at least twice using the same arm. Certain conditions can cause a difference in blood pressure between your right and left arms. A blood pressure reading that is  higher than normal on one occasion does not mean that you need treatment. If it is not clear whether you have high blood pressure, you may be asked to return on a different day to have your blood pressure checked again. Or, you may be asked to monitor your blood pressure at home for 1 or more weeks. TREATMENT Treating high blood  pressure includes making lifestyle changes and possibly taking medicine. Living a healthy lifestyle can help lower high blood pressure. You may need to change some of your habits. Lifestyle changes may include:  Following the DASH diet. This diet is high in fruits, vegetables, and whole grains. It is low in salt, red meat, and added sugars.  Keep your sodium intake below 2,300 mg per day.  Getting at least 30-45 minutes of aerobic exercise at least 4 times per week.  Losing weight if necessary.  Not smoking.  Limiting alcoholic beverages.  Learning ways to reduce stress. Your health care provider may prescribe medicine if lifestyle changes are not enough to get your blood pressure under control, and if one of the following is true:  You are 33-56 years of age and your systolic blood pressure is above 140.  You are 45 years of age or older, and your systolic blood pressure is above 150.  Your diastolic blood pressure is above 90.  You have diabetes, and your systolic blood pressure is over [id number] or your diastolic blood pressure is over 90.  You have kidney disease and your blood pressure is above 140/90.  You have heart disease and your blood pressure is above 140/90. Your personal target blood pressure may vary depending on your medical conditions, your age, and other factors. HOME CARE INSTRUCTIONS  Have your blood pressure rechecked as directed by your health care provider.   Take medicines only as directed by your health care provider. Follow the directions carefully. Blood pressure medicines must be taken as prescribed. The medicine does not work as well when you skip doses. Skipping doses also puts you at risk for problems.  Do not smoke.   Monitor your blood pressure at home as directed by your health care provider. SEEK MEDICAL CARE IF:   You think you are having a reaction to medicines taken.  You have recurrent headaches or feel dizzy.  You have swelling in your  ankles.  You have trouble with your vision. SEEK IMMEDIATE MEDICAL CARE IF:  You develop a severe headache or confusion.  You have unusual weakness, numbness, or feel faint.  You have severe chest or abdominal pain.  You vomit repeatedly.  You have trouble breathing. MAKE SURE YOU:   Understand these instructions.  Will watch your condition.  Will get help right away if you are not doing well or get worse.   This information is not intended to replace advice given to you by your health care provider. Make sure you discuss any questions you have with your health care provider.   Document Released: 07/03/2005 Document Revised: 11/17/2014 Document Reviewed: 04/25/2013 Elsevier Interactive Patient Education [employer].

## 2015-07-15 NOTE — ED Provider Notes (Signed)
CSN: [id number]     Arrival date & time 07/15/15  1842 History  By signing my name below, I, Jean Blair, attest that this documentation has been prepared under the direction and in the presence of Quintella Reichert, MD. Electronically Signed: Meriel Blair, ED Scribe. 07/15/2015. 9:47 PM.    Chief Complaint  Patient presents with  . Chest Pain   The history is provided by the patient. No language interpreter was used.   HPI Comments: Jean Blair is a 54 y.o. female, with no chronic medical conditions, who presents to the Emergency Department for evaluation of an abnormal EKG, obtained at PCP office PTA. The pt was seen in the ED 6 days ago for chest pain and 'fogginess' when she received a normal head CT and an unremarkable cardiac workup. She was subsequently seen by her PCP today on a follow up appointment when an EKG was obtained and the pt was sent to the ED by her PCP for an abnormal EKG. She notes intermittent 'twitches' in her central chest that are constant for 1 hour since first being seen in the ED 6 days ago. On prior ED visit the pt was prescribed a short course of HCTZ as she was found to have HTN. Pt did not fill this medication and states she wanted to speak with her PCP before doing so. She is also hypertensive in the ED this evening. Pt denies any triggering factors to her chest pain. Also denies diaphoresis, SOB, BLE edema, or syncope. Family history is significant for HTN, MI, and DM maternally.    Past Medical History  Diagnosis Date  . Anxiety   . Anemia   . Hyperlipidemia   . Cataract    Past Surgical History  Procedure Laterality Date  . Tubal ligation  1991  . Colonoscopy     Family History  Problem Relation Age of Onset  . Anxiety disorder    . Diabetes    . Hypertension    . Mental illness    . Alcohol abuse    . Stroke    . Colon cancer Neg Hx   . Diabetes Mother   . Heart disease Maternal Grandfather    Social History  Substance Use Topics   . Smoking status: Former Smoker    Types: Cigarettes    Quit date: 11/18/2000  . Smokeless tobacco: Never Used  . Alcohol Use: 4.2 oz/week    7 Glasses of wine per week   OB History    No data available     Review of Systems  Constitutional: Negative for diaphoresis.  Respiratory: Negative for shortness of breath.   Cardiovascular: Positive for chest pain. Negative for leg swelling.  Neurological: Negative for dizziness, syncope and light-headedness.  Psychiatric/Behavioral: Negative for confusion.   Allergies  Codeine  Home Medications   Prior to Admission medications   Medication Sig Start Date End Date Taking? Authorizing Provider  ALPRAZolam Duanne Moron) 0.5 MG tablet TAKE 1 TABLET BY MOUTH EVERY DAY AS NEEDED 02/10/14   Rosalita Chessman, DO  omeprazole (PRILOSEC) 20 MG capsule Take 1 capsule (20 mg total) by mouth daily. 07/15/15   Quintella Reichert, MD   BP 166/82 mmHg  Pulse 67  Temp(Src) 98.5 F (36.9 C) (Oral)  Resp 16  Ht 5\' 4"  (1.626 m)  Wt 169 lb (76.658 kg)  BMI 28.99 kg/m2  SpO2 100% Physical Exam  Constitutional: She is oriented to person, place, and time. She appears well-developed and well-nourished.  HENT:  Head: Normocephalic and atraumatic.  Cardiovascular: Normal rate and regular rhythm.   No murmur heard. Pulmonary/Chest: Effort normal and breath sounds normal. No respiratory distress.  Abdominal: Soft. There is no tenderness. There is no rebound and no guarding.  Musculoskeletal: She exhibits no edema or tenderness.  Neurological: She is alert and oriented to person, place, and time.  Skin: Skin is warm and dry.  Psychiatric: She has a normal mood and affect. Her behavior is normal.  Nursing note and vitals reviewed.   ED Course  Procedures  DIAGNOSTIC STUDIES: Oxygen Saturation is 100% on RA, normal by my interpretation.    COORDINATION OF CARE: 9:45 PM Discussed treatment plan which includes to order cardiac workup with pt. Pt acknowledges and  agrees to plan.   Labs Review Labs Reviewed  BASIC METABOLIC PANEL - Abnormal; Notable for the following:    Glucose, Bld 103 (*)    All other components within normal limits  CBC WITH DIFFERENTIAL/PLATELET - Abnormal; Notable for the following:    Hemoglobin 11.9 (*)    RDW 15.6 (*)    All other components within normal limits  TROPONIN I  D-DIMER, QUANTITATIVE (NOT AT [facility])    Imaging Review Dg Chest 2 View  07/15/2015  CLINICAL DATA:  Acute onset of left anterior chest pain. Initial encounter. EXAM: CHEST  2 VIEW COMPARISON:  Chest radiograph performed 07/09/2015 FINDINGS: The lungs are well-aerated. Mild peribronchial thickening is noted. There is no evidence of focal opacification, pleural effusion or pneumothorax. The heart is normal in size; the mediastinal contour is within normal limits. No acute osseous abnormalities are seen. IMPRESSION: Mild peribronchial thickening noted.  Lungs otherwise clear. Electronically Signed   By: Garald Balding M.D.   On: 07/15/2015 22:49   I have personally reviewed and evaluated these images and lab results as part of my medical decision-making.   EKG Interpretation   Date/Time:  Thursday July 15 2015 19:07:22 EST Ventricular Rate:  57 PR Interval:  158 QRS Duration: 82 QT Interval:  420 QTC Calculation: 408 R Axis:   57 Text Interpretation:  Sinus bradycardia Otherwise normal ECG Confirmed by  Hazle Coca [phone]) on 07/15/2015 7:15:10 PM      MDM   Final diagnoses:  Atypical chest pain  Essential hypertension   Patient here for evaluation of intermittent chest twitching sensation, mild chest wall tenderness on examination. She is hypertensive in the emergency Department but in no acute distress. Presentation is not consistent with ACS, PE, dissection, hypertensive emergency. Discussed with patient's home care for chest pain, treating for possible GERD as sometimes these symptoms occur with meals. Return precautions were also  discussed.  I personally performed the services described in this documentation, which was scribed in my presence. The recorded information has been reviewed and is accurate.   Quintella Reichert, MD 07/16/15 [id number]

## 2015-07-15 NOTE — Patient Instructions (Addendum)
You have  pain is atypical chest pain and mild but you did have ekg change form prior ekg. So recommend evaluation down stairs or in the main ED [facility]  Please stop smoking completley as that is a major cardiac risk factor.  Schedule appointment for complete physical and fasting labs within 2-3 weeks.  I did talk with ED downstairs. Follow up US in one week or as needed

## 2015-07-15 NOTE — Progress Notes (Signed)
Pre visit review using our clinic review tool, if applicable. No additional management support is needed unless otherwise documented below in the visit note. 

## 2015-07-15 NOTE — Progress Notes (Signed)
Subjective:    Patient ID: Jean Blair, female    DOB: 20-Sep-1960, 54 y.o.   MRN: [id number]  HPI   Pt in today and states she does not have the mental fogginess that she had the other day in the ED. Pt states day of visit she had very strong heart beat and pain in chest as well.  When she went to the ED she had bp of 186/98. Her bp stayed in that range entire time per her report. Also June 18, 2015 at home she had some slight elevation as well.   Pt had work up of ct scan, ekg and some blood work.  Today no ha and no neurologic signs.  Pt today has occasional transient twinge type pain to chest. Comes and goes. For example today no twinge at all until one hour ago . Then it went away. Then came back waiting for me to come in room. Then returned during interview. Pt has no nausea, no vomiting, no nausea, no vomiting, no jaw pain. Pt twinge went away after about 5 minutes.  Pt has remote hx of elevated cholesterol. She never took medication. Pt is not diabetic. Pt smokes cigars very rare. Some cigarettes very rare. Hx of daily smoking in the past. No direct fh of MI.    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 Work up was negative and she was given xanax.  She rarely takes xanax.   Review of Systems  Constitutional: Negative for fever, chills, diaphoresis, activity change and fatigue.  Respiratory: Negative for cough, chest tightness and shortness of breath.   Cardiovascular: Negative for chest pain, palpitations and leg swelling.       Mild atypical chest discomfort.  Gastrointestinal: Negative for nausea, vomiting and abdominal pain.  Musculoskeletal: Negative for neck pain and neck stiffness.  Neurological: Negative for dizziness, tremors, seizures, syncope, facial asymmetry, speech difficulty, weakness, light-headedness, numbness and headaches.  Psychiatric/Behavioral: Negative for behavioral problems, confusion and  agitation. The patient is not nervous/anxious.     Past Medical History  Diagnosis Date  . Anxiety   . Anemia   . Hyperlipidemia   . Cataract     Social History   Social History  . Marital Status: Single    Spouse Name: N/A  . Number of Children: N/A  . Years of Education: N/A   Occupational History  . Not on file.   Social History Main Topics  . Smoking status: Former Smoker    Types: Cigarettes    Quit date: 11/18/2000  . Smokeless tobacco: Never Used  . Alcohol Use: 4.2 oz/week    7 Glasses of wine per week  . Drug Use: No  . Sexual Activity: Not on file   Other Topics Concern  . Not on file   Social History Narrative    Past Surgical History  Procedure Laterality Date  . Tubal ligation  1991  . Colonoscopy      Family History  Problem Relation Age of Onset  . Anxiety disorder    . Diabetes    . Hypertension    . Mental illness    . Alcohol abuse    . Stroke    . Colon cancer Neg Hx   . Diabetes Mother   . Heart disease Maternal Grandfather     Allergies  Allergen Reactions  . Codeine     REACTION: itching    Current Outpatient Prescriptions  on File Prior to Visit  Medication Sig Dispense Refill  . ALPRAZolam (XANAX) 0.5 MG tablet TAKE 1 TABLET BY MOUTH EVERY DAY AS NEEDED 30 tablet 0   No current facility-administered medications on file prior to visit.    BP 130/80 mmHg  Pulse 65  Temp(Src) 98.3 F (36.8 C) (Oral)  Ht 5' 4.5" (1.638 m)  Wt 169 lb (76.658 kg)  BMI 28.57 kg/m2  SpO2 98%       Objective:   Physical Exam  General Mental Status- Alert. General Appearance- Not in acute distress.   Skin General: Color- Normal Color. Moisture- Normal Moisture.  Neck Carotid Arteries- Normal color. Moisture- Normal Moisture. No carotid bruits. No JVD.  Chest and Lung Exam Auscultation: Breath Sounds:-Normal.  Cardiovascular Auscultation:Rythm- Regular. Murmurs & Other Heart Sounds:Auscultation of the heart reveals- No  Murmurs.  Abdomen Inspection:-Inspeection Normal. Palpation/Percussion:Note:No mass. Palpation and Percussion of the abdomen reveal- Non Tender, Non Distended + BS, no rebound or guarding.    Neurologic Cranial Nerve exam:- CN III-XII intact(No nystagmus), symmetric smile.  Finger to Nose:- Normal/Intact Strength:- 5/5 equal and symmetric strength both upper and lower extremities.      Assessment & Plan:  Pt ekg looked to have negative t wave in v2. This appeared different from prior ekg done about one week ago.  Your pain is atypical chest pain and mild but you did have ekg change form prior ekg. So recommend evaluation down stairs or in the main ED [facility]  Please stop smoking completley as that is a major cardiac risk factor.  Schedule appointment for complete physical and fasting labs within 2-3 weeks.  I did talk with ED downstairs. Follow up US in one week or as needed

## 2015-07-20 ENCOUNTER — Ambulatory Visit: Payer: Managed Care, Other (non HMO) | Admitting: Cardiology

## 2015-07-21 ENCOUNTER — Encounter: Payer: Self-pay | Admitting: Cardiology

## 2015-08-27 ENCOUNTER — Encounter: Payer: Self-pay | Admitting: Family Medicine

## 2015-08-27 ENCOUNTER — Ambulatory Visit (INDEPENDENT_AMBULATORY_CARE_PROVIDER_SITE_OTHER): Payer: Managed Care, Other (non HMO) | Admitting: Family Medicine

## 2015-08-27 VITALS — BP 142/69 | HR 69 | Temp 98.8°F | Wt 168.0 lb

## 2015-08-27 DIAGNOSIS — E785 Hyperlipidemia, unspecified: Secondary | ICD-10-CM

## 2015-08-27 DIAGNOSIS — Z1159 Encounter for screening for other viral diseases: Secondary | ICD-10-CM | POA: Diagnosis not present

## 2015-08-27 DIAGNOSIS — F419 Anxiety disorder, unspecified: Secondary | ICD-10-CM

## 2015-08-27 DIAGNOSIS — I10 Essential (primary) hypertension: Secondary | ICD-10-CM | POA: Diagnosis not present

## 2015-08-27 DIAGNOSIS — Z114 Encounter for screening for human immunodeficiency virus [HIV]: Secondary | ICD-10-CM | POA: Diagnosis not present

## 2015-08-27 LAB — COMPREHENSIVE METABOLIC PANEL
ALT: 18 U/L (ref 0–35)
AST: 19 U/L (ref 0–37)
Albumin: 4.4 g/dL (ref 3.5–5.2)
Alkaline Phosphatase: 57 U/L (ref 39–117)
BUN: 15 mg/dL (ref 6–23)
CALCIUM: 10.6 mg/dL — AB (ref 8.4–10.5)
CHLORIDE: 102 meq/L (ref 96–112)
CO2: 30 meq/L (ref 19–32)
Creatinine, Ser: 1.01 mg/dL (ref 0.40–1.20)
GFR: 73.85 mL/min (ref >60.00)
GLUCOSE: 110 mg/dL — AB (ref 70–99)
Potassium: 4.1 mEq/L (ref 3.5–5.1)
Sodium: 138 mEq/L (ref 135–145)
Total Bilirubin: 0.5 mg/dL (ref 0.2–1.2)
Total Protein: 8 g/dL (ref 6.0–8.3)

## 2015-08-27 LAB — LIPID PANEL
CHOL/HDL RATIO: 6
CHOLESTEROL: 293 mg/dL — AB (ref 0–200)
HDL: 49.9 mg/dL (ref >39.00)
NONHDL: 243.43
TRIGLYCERIDES: 228 mg/dL — AB (ref 0.0–149.0)
VLDL: 45.6 mg/dL — AB (ref 0.0–40.0)

## 2015-08-27 LAB — LDL CHOLESTEROL, DIRECT: Direct LDL: 208 mg/dL

## 2015-08-27 MED ORDER — ALPRAZOLAM 0.5 MG PO TABS: ORAL_TABLET | ORAL | Status: DC

## 2015-08-27 MED ORDER — LISINOPRIL-HYDROCHLOROTHIAZIDE 10-12.5 MG PO TABS: 1.0000 | ORAL_TABLET | Freq: Every day | ORAL | Status: DC

## 2015-08-27 NOTE — Progress Notes (Signed)
Pre visit review using our clinic review tool, if applicable. No additional management support is needed unless otherwise documented below in the visit note. 

## 2015-08-27 NOTE — Patient Instructions (Signed)
Hypertension Hypertension, commonly called high blood pressure, is when the force of blood pumping through your arteries is too strong. Your arteries are the blood vessels that carry blood from your heart throughout your body. A blood pressure reading consists of a higher number over a lower number, such as 110/72. The higher number (systolic) is the pressure inside your arteries when your heart pumps. The lower number (diastolic) is the pressure inside your arteries when your heart relaxes. Ideally you want your blood pressure below 120/80. Hypertension forces your heart to work harder to pump blood. Your arteries may become narrow or stiff. Having untreated or uncontrolled hypertension can cause heart attack, stroke, kidney disease, and other problems. RISK FACTORS Some risk factors for high blood pressure are controllable. Others are not.  Risk factors you cannot control include:   Race. You may be at higher risk if you are African American.  Age. Risk increases with age.  Gender. Men are at higher risk than women before age 45 years. After age 65, women are at higher risk than men. Risk factors you can control include:  Not getting enough exercise or physical activity.  Being overweight.  Getting too much fat, sugar, calories, or salt in your diet.  Drinking too much alcohol. SIGNS AND SYMPTOMS Hypertension does not usually cause signs or symptoms. Extremely high blood pressure (hypertensive crisis) may cause headache, anxiety, shortness of breath, and nosebleed. DIAGNOSIS To check if you have hypertension, your health care provider will measure your blood pressure while you are seated, with your arm held at the level of your heart. It should be measured at least twice using the same arm. Certain conditions can cause a difference in blood pressure between your right and left arms. A blood pressure reading that is higher than normal on one occasion does not mean that you need treatment. If  it is not clear whether you have high blood pressure, you may be asked to return on a different day to have your blood pressure checked again. Or, you may be asked to monitor your blood pressure at home for 1 or more weeks. TREATMENT Treating high blood pressure includes making lifestyle changes and possibly taking medicine. Living a healthy lifestyle can help lower high blood pressure. You may need to change some of your habits. Lifestyle changes may include:  Following the DASH diet. This diet is high in fruits, vegetables, and whole grains. It is low in salt, red meat, and added sugars.  Keep your sodium intake below 2,300 mg per day.  Getting at least 30-45 minutes of aerobic exercise at least 4 times per week.  Losing weight if necessary.  Not smoking.  Limiting alcoholic beverages.  Learning ways to reduce stress. Your health care provider may prescribe medicine if lifestyle changes are not enough to get your blood pressure under control, and if one of the following is true:  You are 18-59 years of age and your systolic blood pressure is above 140.  You are 60 years of age or older, and your systolic blood pressure is above 150.  Your diastolic blood pressure is above 90.  You have diabetes, and your systolic blood pressure is over 140 or your diastolic blood pressure is over 90.  You have kidney disease and your blood pressure is above 140/90.  You have heart disease and your blood pressure is above 140/90. Your personal target blood pressure may vary depending on your medical conditions, your age, and other factors. HOME CARE INSTRUCTIONS    Have your blood pressure rechecked as directed by your health care provider.   Take medicines only as directed by your health care provider. Follow the directions carefully. Blood pressure medicines must be taken as prescribed. The medicine does not work as well when you skip doses. Skipping doses also puts you at risk for  problems.  Do not smoke.   Monitor your blood pressure at home as directed by your health care provider. SEEK MEDICAL CARE IF:   You think you are having a reaction to medicines taken.  You have recurrent headaches or feel dizzy.  You have swelling in your ankles.  You have trouble with your vision. SEEK IMMEDIATE MEDICAL CARE IF:  You develop a severe headache or confusion.  You have unusual weakness, numbness, or feel faint.  You have severe chest or abdominal pain.  You vomit repeatedly.  You have trouble breathing. MAKE SURE YOU:   Understand these instructions.  Will watch your condition.  Will get help right away if you are not doing well or get worse.   This information is not intended to replace advice given to you by your health care provider. Make sure you discuss any questions you have with your health care provider.   Document Released: 07/03/2005 Document Revised: 11/17/2014 Document Reviewed: 04/25/2013 Elsevier Interactive Patient Education 2016 [employer]  

## 2015-08-27 NOTE — Progress Notes (Signed)
Patient ID: Jean Blair, female    DOB: 1961/02/17  Age: 55 y.o. MRN: [id number]    Subjective:  Subjective HPI Jean Blair presents for f/u bp and er.  She went to the ER with chest pain.  W/u neg but bp was high. No cp since  Review of Systems  Constitutional: Negative for diaphoresis, appetite change, fatigue and unexpected weight change.  Eyes: Negative for pain, redness and visual disturbance.  Respiratory: Negative for cough, chest tightness, shortness of breath and wheezing.   Cardiovascular: Negative for chest pain, palpitations and leg swelling.  Endocrine: Negative for cold intolerance, heat intolerance, polydipsia, polyphagia and polyuria.  Genitourinary: Negative for dysuria, frequency and difficulty urinating.  Neurological: Negative for dizziness, light-headedness, numbness and headaches.    History Past Medical History  Diagnosis Date  . Anxiety   . Anemia   . Hyperlipidemia   . Cataract     She has past surgical history that includes Tubal ligation (1991) and Colonoscopy.   Her family history includes Diabetes in her mother; Heart disease in her maternal grandfather. There is no history of Colon cancer.She reports that she quit smoking about 14 years ago. Her smoking use included Cigarettes. She has never used smokeless tobacco. She reports that she drinks about 4.2 oz of alcohol per week. She reports that she does not use illicit drugs.  Current Outpatient Prescriptions on File Prior to Visit  Medication Sig Dispense Refill  . omeprazole (PRILOSEC) 20 MG capsule Take 1 capsule (20 mg total) by mouth daily. (Patient not taking: Reported on 08/27/2015) 30 capsule 0   No current facility-administered medications on file prior to visit.     Objective:  Objective Physical Exam  Constitutional: She is oriented to person, place, and time. She appears well-developed and well-nourished.  HENT:  Head: Normocephalic and atraumatic.  Eyes: Conjunctivae and  EOM are normal.  Neck: Normal range of motion. Neck supple. No JVD present. Carotid bruit is not present. No thyromegaly present.  Cardiovascular: Normal rate, regular rhythm and normal heart sounds.   No murmur heard. Pulmonary/Chest: Effort normal and breath sounds normal. No respiratory distress. She has no wheezes. She has no rales. She exhibits no tenderness.  Musculoskeletal: She exhibits no edema.  Neurological: She is alert and oriented to person, place, and time.  Psychiatric: She has a normal mood and affect. Her behavior is normal. Judgment and thought content normal.  Nursing note and vitals reviewed.  BP 142/69 mmHg  Pulse 69  Temp(Src) 98.8 F (37.1 C) (Oral)  Wt 168 lb (76.204 kg)  SpO2 98% Wt Readings from Last 3 Encounters:  08/27/15 168 lb (76.204 kg)  07/15/15 169 lb (76.658 kg)  07/15/15 169 lb (76.658 kg)     Lab Results  Component Value Date   WBC 7.5 07/15/2015   HGB 11.9* 07/15/2015   HCT 36.9 07/15/2015   PLT 364 07/15/2015   GLUCOSE 110* 08/27/2015   CHOL 293* 08/27/2015   TRIG 228.0* 08/27/2015   HDL 49.90 08/27/2015   LDLDIRECT 208.0 08/27/2015   LDLCALC 116* 02/10/2010   ALT 18 08/27/2015   AST 19 08/27/2015   NA 138 08/27/2015   K 4.1 08/27/2015   CL 102 08/27/2015   CREATININE 1.01 08/27/2015   BUN 15 08/27/2015   CO2 30 08/27/2015   TSH 0.97 10/30/2012    Dg Chest 2 View  07/15/2015  CLINICAL DATA:  Acute onset of left anterior chest pain. Initial encounter. EXAM: CHEST  2  VIEW COMPARISON:  Chest radiograph performed 07/09/2015 FINDINGS: The lungs are well-aerated. Mild peribronchial thickening is noted. There is no evidence of focal opacification, pleural effusion or pneumothorax. The heart is normal in size; the mediastinal contour is within normal limits. No acute osseous abnormalities are seen. IMPRESSION: Mild peribronchial thickening noted.  Lungs otherwise clear. Electronically Signed   By: Garald Balding M.D.   On: 07/15/2015  22:49     Assessment & Plan:  Plan I have discontinued Ms. Jean Blair's hydrochlorothiazide and zolpidem. I am also having her start on lisinopril-hydrochlorothiazide. Additionally, I am having her maintain her omeprazole and ALPRAZolam.  Meds ordered this encounter  Medications  . DISCONTD: hydrochlorothiazide (HYDRODIURIL) 12.5 MG tablet    Sig: Take 1 tablet by mouth daily.  Marland Kitchen DISCONTD: zolpidem (AMBIEN) 10 MG tablet    Sig: Take 1 tablet by mouth at bedtime as needed.  . ALPRAZolam (XANAX) 0.5 MG tablet    Sig: TAKE 1 TABLET BY MOUTH EVERY DAY AS NEEDED    Dispense:  30 tablet    Refill:  5  . lisinopril-hydrochlorothiazide (PRINZIDE,ZESTORETIC) 10-12.5 MG tablet    Sig: Take 1 tablet by mouth daily.    Dispense:  90 tablet    Refill:  3    Problem List Items Addressed This Visit    None    Visit Diagnoses    Essential hypertension    -  Primary    Relevant Medications    lisinopril-hydrochlorothiazide (PRINZIDE,ZESTORETIC) 10-12.5 MG tablet    Other Relevant Orders    Lipid panel (Completed)    Comp Met (CMET) (Completed)    Hyperlipidemia        Relevant Medications    lisinopril-hydrochlorothiazide (PRINZIDE,ZESTORETIC) 10-12.5 MG tablet    Other Relevant Orders    Lipid panel (Completed)    Comp Met (CMET) (Completed)    Need for hepatitis C screening test        Relevant Orders    Hepatitis C antibody (Completed)    Encounter for screening for HIV        Relevant Orders    HIV antibody (Completed)    Anxiety        Relevant Medications    ALPRAZolam (XANAX) 0.5 MG tablet       Follow-up: Return in about 3 months (around 11/24/2015), or if symptoms worsen or fail to improve, for hypertension.  Garnet Koyanagi, DO

## 2015-08-28 LAB — HEPATITIS C ANTIBODY: HCV Ab: NEGATIVE

## 2015-08-28 LAB — HIV ANTIBODY (ROUTINE TESTING W REFLEX): HIV 1&2 Ab, 4th Generation: NONREACTIVE

## 2015-09-14 ENCOUNTER — Telehealth: Payer: Self-pay | Admitting: Family Medicine

## 2015-09-14 NOTE — Telephone Encounter (Signed)
Please review labs and advise     KP

## 2015-09-14 NOTE — Telephone Encounter (Signed)
Caller name:Samaa Relationship to patient:self Can be reached:[phone] Pharmacy:  Reason for call:wants lab results

## 2015-09-14 NOTE — Telephone Encounter (Signed)
-----   Message from Rosalita Chessman, DO sent at 09/14/2015  6:58 PM EST ----- Cholesterol--- LDL goal < 100,  HDL >40,  TG < 150.  Diet and exercise will increase HDL and decrease LDL and TG.  Fish,  Fish Oil, Flaxseed oil will also help increase the HDL and decrease Triglycerides.   Recheck labs in 3 months--- she needs to take a med for cholesterol zocor 20 mg #30  1 po qhs, 2 refills  Recheck 3 months Hep c and hiv normal Calcium is high--- check fasting PTH.

## 2015-09-14 NOTE — Telephone Encounter (Signed)
Message left to call the office.    KP 

## 2015-09-16 NOTE — Telephone Encounter (Signed)
Pt called for lab results again. She states that last call came from a restricted # and she doesn't answer "restricted" calls. She asked for call back from a ph# that will show "[place]". Advised pt that we are trying to resolve this issue but our # is showing as restricted or unknown to most of our patients. She states she will keep her phone on her tomorrow at work. She would prefer a call early in the morning if possible. Please call [phone].

## 2015-09-17 MED ORDER — SIMVASTATIN 20 MG PO TABS: 20.0000 mg | ORAL_TABLET | Freq: Every day | ORAL | Status: DC

## 2015-09-17 NOTE — Telephone Encounter (Signed)
Reviewed the below labs with the patient and she verbalized understanding, she has agreed to start the Zocor 20 mg daily and she will also come in next week to have her PTH checked. A copy of the labs have been mailed to the patient.     KP

## 2015-09-27 ENCOUNTER — Other Ambulatory Visit: Payer: Managed Care, Other (non HMO)

## 2015-10-14 ENCOUNTER — Other Ambulatory Visit (INDEPENDENT_AMBULATORY_CARE_PROVIDER_SITE_OTHER): Payer: Managed Care, Other (non HMO)

## 2015-10-15 LAB — PTH, INTACT AND CALCIUM
Calcium: 9.9 mg/dL (ref 8.4–10.5)
PTH: 54 pg/mL (ref 14–64)

## 2015-11-23 ENCOUNTER — Ambulatory Visit: Payer: Managed Care, Other (non HMO) | Admitting: Family Medicine

## 2015-12-03 ENCOUNTER — Ambulatory Visit (INDEPENDENT_AMBULATORY_CARE_PROVIDER_SITE_OTHER): Payer: Managed Care, Other (non HMO) | Admitting: Family Medicine

## 2015-12-03 ENCOUNTER — Encounter: Payer: Self-pay | Admitting: Family Medicine

## 2015-12-03 VITALS — BP 130/82 | HR 76 | Temp 98.2°F | Ht 64.0 in | Wt 169.0 lb

## 2015-12-03 DIAGNOSIS — B351 Tinea unguium: Secondary | ICD-10-CM

## 2015-12-03 DIAGNOSIS — J069 Acute upper respiratory infection, unspecified: Secondary | ICD-10-CM | POA: Diagnosis not present

## 2015-12-03 DIAGNOSIS — R635 Abnormal weight gain: Secondary | ICD-10-CM | POA: Diagnosis not present

## 2015-12-03 DIAGNOSIS — I10 Essential (primary) hypertension: Secondary | ICD-10-CM | POA: Diagnosis not present

## 2015-12-03 LAB — COMPREHENSIVE METABOLIC PANEL
ALT: 26 U/L (ref 0–35)
AST: 24 U/L (ref 0–37)
Albumin: 4.5 g/dL (ref 3.5–5.2)
Alkaline Phosphatase: 68 U/L (ref 39–117)
BILIRUBIN TOTAL: 0.3 mg/dL (ref 0.2–1.2)
BUN: 9 mg/dL (ref 6–23)
CALCIUM: 10.2 mg/dL (ref 8.4–10.5)
CHLORIDE: 104 meq/L (ref 96–112)
CO2: 30 meq/L (ref 19–32)
CREATININE: 0.87 mg/dL (ref 0.40–1.20)
GFR: 87.63 mL/min (ref >60.00)
GLUCOSE: 96 mg/dL (ref 70–99)
Potassium: 3.8 mEq/L (ref 3.5–5.1)
SODIUM: 139 meq/L (ref 135–145)
Total Protein: 7.6 g/dL (ref 6.0–8.3)

## 2015-12-03 LAB — THYROID PANEL WITH TSH
FREE THYROXINE INDEX: 1.7 (ref 1.4–3.8)
T3 UPTAKE: 32 % (ref 22–35)
T4, Total: 5.4 ug/dL (ref 4.5–12.0)
TSH: 2.12 m[IU]/L

## 2015-12-03 MED ORDER — LEVOCETIRIZINE DIHYDROCHLORIDE 5 MG PO TABS: 5.0000 mg | ORAL_TABLET | Freq: Every evening | ORAL | Status: DC

## 2015-12-03 MED ORDER — CICLOPIROX 8 % EX SOLN: Freq: Every day | CUTANEOUS | Status: DC

## 2015-12-03 MED ORDER — FLUTICASONE PROPIONATE 50 MCG/ACT NA SUSP: 2.0000 | Freq: Every day | NASAL | Status: DC

## 2015-12-03 MED ORDER — AMOXICILLIN 875 MG PO TABS: 875.0000 mg | ORAL_TABLET | Freq: Two times a day (BID) | ORAL | Status: DC

## 2015-12-03 NOTE — Progress Notes (Signed)
Pre visit review using our clinic tool,if applicable. No additional management support is needed unless otherwise documented below in the visit note.  

## 2015-12-03 NOTE — Progress Notes (Signed)
  Subjective:     Jean Blair is a 55 y.o. female who presents for evaluation of sinus pain. Symptoms include: congestion, cough, facial pain, headaches, nasal congestion, post nasal drip, purulent rhinorrhea, sinus pressure and sneezing. Onset of symptoms was 3 days ago. Symptoms have been gradually worsening since that time. Past history is significant for no history of pneumonia or bronchitis. Patient is a non-smoker.  The following portions of the patient's history were reviewed and updated as appropriate:  She  has a past medical history of Anxiety; Anemia; Hyperlipidemia; and Cataract. She  does not have any pertinent problems on file. She  has past surgical history that includes Tubal ligation (1991) and Colonoscopy. Her family history includes Diabetes in her mother; Heart disease in her maternal grandfather. There is no history of Colon cancer. She  reports that she quit smoking about 15 years ago. Her smoking use included Cigarettes. She has never used smokeless tobacco. She reports that she drinks about 4.2 oz of alcohol per week. She reports that she does not use illicit drugs. She has a current medication list which includes the following prescription(s): alprazolam, lisinopril-hydrochlorothiazide, and simvastatin. Current Outpatient Prescriptions on File Prior to Visit  Medication Sig Dispense Refill  . ALPRAZolam (XANAX) 0.5 MG tablet TAKE 1 TABLET BY MOUTH EVERY DAY AS NEEDED 30 tablet 5  . lisinopril-hydrochlorothiazide (PRINZIDE,ZESTORETIC) 10-12.5 MG tablet Take 1 tablet by mouth daily. 90 tablet 3  . simvastatin (ZOCOR) 20 MG tablet Take 1 tablet (20 mg total) by mouth at bedtime. 30 tablet 5   No current facility-administered medications on file prior to visit.   She is allergic to codeine..  Review of Systems Pertinent items are noted in HPI.   Objective:    BP 130/82 mmHg  Pulse 76  Temp(Src) 98.2 F (36.8 C) (Oral)  Ht 5\' 4"  (1.626 m)  Wt 169 lb (76.658  kg)  BMI 28.99 kg/m2  SpO2 98% General appearance: alert, cooperative, appears stated age and no distress Ears: normal TM's and external ear canals both ears Nose: green discharge, moderate congestion, turbinates red, swollen, sinus tenderness bilateral Throat: lips, mucosa, and tongue normal; teeth and gums normal Neck: no adenopathy, supple, symmetrical, trachea midline and thyroid not enlarged, symmetric, no tenderness/mass/nodules Lungs: clear to auscultation bilaterally Heart: S1, S2 normal    Assessment:    Acute allergic sinusitis.    Plan:    Nasal steroids per medication orders. Antihistamines per medication orders. if symptoms last longer than another week fill rx abx

## 2015-12-21 ENCOUNTER — Other Ambulatory Visit: Payer: Self-pay

## 2015-12-21 MED ORDER — SIMVASTATIN 20 MG PO TABS: 20.0000 mg | ORAL_TABLET | Freq: Every day | ORAL | Status: DC

## 2016-01-24 ENCOUNTER — Telehealth: Payer: Self-pay | Admitting: *Deleted

## 2016-01-24 NOTE — Telephone Encounter (Signed)
PA initiated on [email or web address]. Key: [id number] Awaiting determination

## 2016-01-26 NOTE — Telephone Encounter (Signed)
PA approved effective 01/24/16 through 01/23/17 Approval letter sent for scanning

## 2016-03-06 LAB — HM DEXA SCAN: HM Dexa Scan: NORMAL

## 2016-03-09 ENCOUNTER — Other Ambulatory Visit: Payer: Self-pay | Admitting: Family Medicine

## 2016-03-09 DIAGNOSIS — F419 Anxiety disorder, unspecified: Secondary | ICD-10-CM

## 2016-03-22 ENCOUNTER — Encounter: Payer: Self-pay | Admitting: Family Medicine

## 2016-06-06 ENCOUNTER — Encounter: Payer: Self-pay | Admitting: Family Medicine

## 2016-06-06 ENCOUNTER — Ambulatory Visit (INDEPENDENT_AMBULATORY_CARE_PROVIDER_SITE_OTHER): Payer: Managed Care, Other (non HMO) | Admitting: Family Medicine

## 2016-06-06 VITALS — BP 131/85 | HR 71 | Resp 16 | Ht 64.0 in | Wt 165.0 lb

## 2016-06-06 DIAGNOSIS — Z202 Contact with and (suspected) exposure to infections with a predominantly sexual mode of transmission: Secondary | ICD-10-CM | POA: Diagnosis not present

## 2016-06-06 DIAGNOSIS — Z0000 Encounter for general adult medical examination without abnormal findings: Secondary | ICD-10-CM

## 2016-06-06 DIAGNOSIS — Z23 Encounter for immunization: Secondary | ICD-10-CM

## 2016-06-06 DIAGNOSIS — I781 Nevus, non-neoplastic: Secondary | ICD-10-CM | POA: Diagnosis not present

## 2016-06-06 DIAGNOSIS — F419 Anxiety disorder, unspecified: Secondary | ICD-10-CM

## 2016-06-06 DIAGNOSIS — I10 Essential (primary) hypertension: Secondary | ICD-10-CM

## 2016-06-06 DIAGNOSIS — B351 Tinea unguium: Secondary | ICD-10-CM

## 2016-06-06 LAB — CBC WITH DIFFERENTIAL/PLATELET
BASOS ABS: 0 10*3/uL (ref 0.0–0.1)
Basophils Relative: 0.1 % (ref 0.0–3.0)
EOS ABS: 0.1 10*3/uL (ref 0.0–0.7)
Eosinophils Relative: 1.5 % (ref 0.0–5.0)
HCT: 43.6 % (ref 36.0–46.0)
Hemoglobin: 14.7 g/dL (ref 12.0–15.0)
LYMPHS ABS: 1.7 10*3/uL (ref 0.7–4.0)
Lymphocytes Relative: 19.6 % (ref 12.0–46.0)
MCHC: 33.7 g/dL (ref 30.0–36.0)
MCV: 89.6 fl (ref 78.0–100.0)
MONO ABS: 0.5 10*3/uL (ref 0.1–1.0)
Monocytes Relative: 5.4 % (ref 3.0–12.0)
NEUTROS PCT: 73.4 % (ref 43.0–77.0)
Neutro Abs: 6.5 10*3/uL (ref 1.4–7.7)
Platelets: 338 10*3/uL (ref 150.0–400.0)
RBC: 4.87 Mil/uL (ref 3.87–5.11)
RDW: 13.1 % (ref 11.5–15.5)
WBC: 8.8 10*3/uL (ref 4.0–10.5)

## 2016-06-06 LAB — POCT URINALYSIS DIPSTICK
Bilirubin, UA: NEGATIVE
Glucose, UA: NEGATIVE
Ketones, UA: POSITIVE
LEUKOCYTES UA: NEGATIVE
NITRITE UA: NEGATIVE
PH UA: 6
Spec Grav, UA: 1.02
Urobilinogen, UA: 0.2

## 2016-06-06 LAB — COMPREHENSIVE METABOLIC PANEL
ALT: 29 U/L (ref 0–35)
AST: 26 U/L (ref 0–37)
Albumin: 4.5 g/dL (ref 3.5–5.2)
Alkaline Phosphatase: 55 U/L (ref 39–117)
BILIRUBIN TOTAL: 0.4 mg/dL (ref 0.2–1.2)
BUN: 10 mg/dL (ref 6–23)
CO2: 29 meq/L (ref 19–32)
Calcium: 10.5 mg/dL (ref 8.4–10.5)
Chloride: 102 mEq/L (ref 96–112)
Creatinine, Ser: 0.87 mg/dL (ref 0.40–1.20)
GFR: 87.46 mL/min (ref >60.00)
GLUCOSE: 104 mg/dL — AB (ref 70–99)
POTASSIUM: 3.8 meq/L (ref 3.5–5.1)
Sodium: 139 mEq/L (ref 135–145)
Total Protein: 7.3 g/dL (ref 6.0–8.3)

## 2016-06-06 LAB — LIPID PANEL
CHOL/HDL RATIO: 3
Cholesterol: 191 mg/dL (ref 0–200)
HDL: 55.8 mg/dL (ref >39.00)
LDL CALC: 117 mg/dL — AB (ref 0–99)
NONHDL: 135.14
Triglycerides: 89 mg/dL (ref 0.0–149.0)
VLDL: 17.8 mg/dL (ref 0.0–40.0)

## 2016-06-06 LAB — HIV ANTIBODY (ROUTINE TESTING W REFLEX): HIV: NONREACTIVE

## 2016-06-06 LAB — TSH: TSH: 0.98 u[IU]/mL (ref 0.35–4.50)

## 2016-06-06 MED ORDER — CICLOPIROX 8 % EX SOLN: Freq: Every day | CUTANEOUS | 0 refills | Status: DC

## 2016-06-06 MED ORDER — ALPRAZOLAM 0.5 MG PO TABS: 0.5000 mg | ORAL_TABLET | Freq: Every day | ORAL | 2 refills | Status: DC | PRN

## 2016-06-06 NOTE — Progress Notes (Signed)
Subjective:   Dictation #1 [id number]  [id number]   Jean Blair is a 55 y.o. female and is here for a comprehensive physical exam. The patient reports no problems.  Social History   Social History  . Marital status: Single    Spouse name: N/A  . Number of children: N/A  . Years of education: N/A   Occupational History  . Not on file.   Social History Main Topics  . Smoking status: Former Smoker    Types: Cigarettes    Quit date: 11/18/2000  . Smokeless tobacco: Never Used  . Alcohol use 4.2 oz/week    7 Glasses of wine per week  . Drug use: No  . Sexual activity: Not on file   Other Topics Concern  . Not on file   Social History Narrative  . No narrative on file   Health Maintenance  Topic Date Due  . TETANUS/TDAP  01/13/1982  . INFLUENZA VACCINE  06/06/2017 (Originally 02/15/2016)  . MAMMOGRAM  06/16/2016  . PAP SMEAR  03/05/2017  . COLONOSCOPY  12/04/2017  . DEXA SCAN  03/06/2021  . Hepatitis C Screening  Completed  . HIV Screening  Completed    The following portions of the patient's history were reviewed and updated as appropriate: She  has a past medical history of Anemia; Anxiety; Cataract; and Hyperlipidemia. She  does not have any pertinent problems on file. She  has a past surgical history that includes Tubal ligation (1991) and Colonoscopy. Her family history includes Diabetes in her mother; Heart disease in her maternal grandfather. She  reports that she quit smoking about 15 years ago. Her smoking use included Cigarettes. She has never used smokeless tobacco. She reports that she drinks about 4.2 oz of alcohol per week . She reports that she does not use drugs. She has a current medication list which includes the following prescription(s): alprazolam, ciclopirox, lisinopril-hydrochlorothiazide, simvastatin, fluticasone, and levocetirizine. Current Outpatient Prescriptions on File Prior to Visit  Medication Sig Dispense Refill  .  lisinopril-hydrochlorothiazide (PRINZIDE,ZESTORETIC) 10-12.5 MG tablet Take 1 tablet by mouth daily. 90 tablet 3  . simvastatin (ZOCOR) 20 MG tablet Take 1 tablet (20 mg total) by mouth at bedtime. 90 tablet 1  . fluticasone (FLONASE) 50 MCG/ACT nasal spray Place 2 sprays into both nostrils daily. (Patient not taking: Reported on 06/06/2016) 16 g 6  . levocetirizine (XYZAL) 5 MG tablet Take 1 tablet (5 mg total) by mouth every evening. (Patient not taking: Reported on 06/06/2016) 30 tablet 5   No current facility-administered medications on file prior to visit.    She is allergic to codeine..  Review of Systems   Review of Systems  Constitutional: Negative for activity change, appetite change and fatigue.  HENT: Negative for hearing loss, congestion, tinnitus and ear discharge.  dentist q69m Eyes: Negative for visual disturbance (see optho q1y -- vision corrected to 20/20 with glasses).  Respiratory: Negative for cough, chest tightness and shortness of breath.   Cardiovascular: Negative for chest pain, palpitations and leg swelling.  Gastrointestinal: Negative for abdominal pain, diarrhea, constipation and abdominal distention.  Genitourinary: Negative for urgency, frequency, decreased urine volume and difficulty urinating.  Musculoskeletal: Negative for back pain, arthralgias and gait problem.  Skin: Negative for color change, pallor and rash.  Neurological: Negative for dizziness, light-headedness, numbness and headaches.  Hematological: Negative for adenopathy. Does not bruise/bleed easily.  Psychiatric/Behavioral: Negative for suicidal ideas, confusion, sleep disturbance, self-injury, dysphoric mood, decreased concentration and agitation.  Objective:    BP 131/85 (BP Location: Left Arm, Patient Position: Sitting, Cuff Size: Normal)   Pulse 71   Resp 16   Ht 5\' 4"  (1.626 m)   Wt 165 lb (74.8 kg)   SpO2 100%   BMI 28.32 kg/m  General appearance: alert, cooperative, appears  older than stated age and no distress Head: Normocephalic, without obvious abnormality, atraumatic Eyes: conjunctivae/corneas clear. PERRL, EOM's intact. Fundi benign. Ears: normal TM's and external ear canals both ears Nose: Nares normal. Septum midline. Mucosa normal. No drainage or sinus tenderness. Throat: lips, mucosa, and tongue normal; teeth and gums normal Neck: no adenopathy, no carotid bruit, no JVD, supple, symmetrical, trachea midline and thyroid not enlarged, symmetric, no tenderness/mass/nodules Back: symmetric, no curvature. ROM normal. No CVA tenderness. Lungs: clear to auscultation bilaterally Breasts: normal appearance, no masses or tenderness Heart: regular rate and rhythm, S1, S2 normal, no murmur, click, rub or gallop Abdomen: soft, non-tender; bowel sounds normal; no masses,  no organomegaly Pelvic: deferred--gyn Extremities: extremities normal, atraumatic, no cyanosis or edema Pulses: 2+ and symmetric Skin: Skin color, texture, turgor normal. No rashes or lesions Lymph nodes: Cervical, supraclavicular, and axillary nodes normal. Neurologic: Alert and oriented X 3, normal strength and tone. Normal symmetric reflexes. Normal coordination and gait    Assessment:    Healthy female exam.      Plan:    ghm utd-- tdap given Check labs  See After Visit Summary for Counseling Recommendations    1. Nevus, non-neoplastic  - Ambulatory referral to Dermatology - Urine Culture  2. Preventative health care See above - Comprehensive metabolic panel - Lipid panel - CBC with Differential/Platelet - POCT urinalysis dipstick - TSH - HIV antibody - Hepatitis C antibody - HSV(herpes simplex vrs) 1+2 ab-IgG - Hepatitis B surface antibody - Hepatitis B surface antigen - Urine Culture  3. STD exposure  - Comprehensive metabolic panel - Lipid panel - CBC with Differential/Platelet - POCT urinalysis dipstick - TSH - HIV antibody - Hepatitis C antibody -  HSV(herpes simplex vrs) 1+2 ab-IgG - Hepatitis B surface antibody - Hepatitis B surface antigen - Urine cytology ancillary only - Urine Culture  4. Anxiety  - ALPRAZolam (XANAX) 0.5 MG tablet; Take 1 tablet (0.5 mg total) by mouth daily as needed.  Dispense: 30 tablet; Refill: 2 - Urine Culture  5. Toenail fungus Resolving  - ciclopirox (PENLAC) 8 % solution; Apply topically at bedtime. Apply over nail and surrounding skin. Apply daily over previous coat. After seven (7) days, may remove with alcohol and continue cycle.  Dispense: 6.6 mL; Refill: 0 - Urine Culture  6. Essential hypertension Stable con't meds -- lisinopril hct - Urine Culture  7. Need for Tdap vaccination   - Tdap vaccine greater than or equal to 7yo IM - Urine Culture

## 2016-06-06 NOTE — Progress Notes (Signed)
Pre visit review using our clinic review tool, if applicable. No additional management support is needed unless otherwise documented below in the visit note. 

## 2016-06-06 NOTE — Patient Instructions (Signed)

## 2016-06-07 LAB — HSV(HERPES SIMPLEX VRS) I + II AB-IGG
HSV 1 Glycoprotein G Ab, IgG: 57 Index — ABNORMAL HIGH (ref <0.90)
HSV 2 Glycoprotein G Ab, IgG: 3.63 Index — ABNORMAL HIGH (ref <0.90)

## 2016-06-07 LAB — HEPATITIS B SURFACE ANTIBODY,QUALITATIVE: HEP B S AB: NEGATIVE

## 2016-06-07 LAB — HEPATITIS B SURFACE ANTIGEN: Hepatitis B Surface Ag: NEGATIVE

## 2016-06-07 LAB — HEPATITIS C ANTIBODY: HCV AB: NEGATIVE

## 2016-06-07 LAB — URINE CULTURE: ORGANISM ID, BACTERIA: NO GROWTH

## 2016-06-09 ENCOUNTER — Other Ambulatory Visit: Payer: Self-pay | Admitting: Family Medicine

## 2016-06-09 DIAGNOSIS — B351 Tinea unguium: Secondary | ICD-10-CM

## 2016-07-12 ENCOUNTER — Encounter: Payer: Self-pay | Admitting: Family Medicine

## 2016-09-03 ENCOUNTER — Other Ambulatory Visit: Payer: Self-pay | Admitting: Family Medicine

## 2016-09-03 DIAGNOSIS — I10 Essential (primary) hypertension: Secondary | ICD-10-CM

## 2016-09-04 NOTE — Telephone Encounter (Signed)
Patient needs follow up for hypertension, hyperlipidemia around 12/04/16   thx PC

## 2016-09-09 ENCOUNTER — Other Ambulatory Visit: Payer: Self-pay | Admitting: Family Medicine

## 2016-09-09 DIAGNOSIS — F419 Anxiety disorder, unspecified: Secondary | ICD-10-CM

## 2016-09-11 NOTE — Telephone Encounter (Signed)
Requesting:  Alprazolam Contract   06/06/2016 UDS   Low risk---next due 12/04/2016 Last OV   06/06/2016 Last Refill    #30 with 2 refills on 06/06/2016  Please Advise

## 2016-09-11 NOTE — Telephone Encounter (Signed)
Faxed hardcopy for alprazolam to [facility] on [address] [place]

## 2016-09-12 ENCOUNTER — Encounter: Payer: Self-pay | Admitting: Family Medicine

## 2016-09-12 ENCOUNTER — Ambulatory Visit (INDEPENDENT_AMBULATORY_CARE_PROVIDER_SITE_OTHER): Payer: Managed Care, Other (non HMO) | Admitting: Family Medicine

## 2016-09-12 VITALS — BP 172/82 | HR 65 | Temp 98.2°F | Resp 16 | Ht 64.0 in | Wt 180.6 lb

## 2016-09-12 DIAGNOSIS — I10 Essential (primary) hypertension: Secondary | ICD-10-CM

## 2016-09-12 DIAGNOSIS — R002 Palpitations: Secondary | ICD-10-CM | POA: Diagnosis not present

## 2016-09-12 DIAGNOSIS — Z8249 Family history of ischemic heart disease and other diseases of the circulatory system: Secondary | ICD-10-CM

## 2016-09-12 DIAGNOSIS — R079 Chest pain, unspecified: Secondary | ICD-10-CM | POA: Diagnosis not present

## 2016-09-12 MED ORDER — LISINOPRIL-HYDROCHLOROTHIAZIDE 20-25 MG PO TABS: 1.0000 | ORAL_TABLET | Freq: Every day | ORAL | 1 refills | Status: DC

## 2016-09-12 NOTE — Assessment & Plan Note (Signed)
Stress test Echo If pain returns-- go to ER

## 2016-09-12 NOTE — Progress Notes (Signed)
Patient ID: Jean Blair, female    DOB: Jun 13, 1961  Age: 56 y.o. MRN: [id number]    Subjective:  Subjective  HPI Jean Blair presents for f/u bp.  It has been running high.  + headaches and palpitations.  No slurred speech, weakness.    Review of Systems  Constitutional: Negative for appetite change, diaphoresis, fatigue and unexpected weight change.  Eyes: Negative for pain, redness and visual disturbance.  Respiratory: Negative for cough, chest tightness, shortness of breath and wheezing.   Cardiovascular: Negative for chest pain, palpitations and leg swelling.  Endocrine: Negative for cold intolerance, heat intolerance, polydipsia, polyphagia and polyuria.  Genitourinary: Negative for difficulty urinating, dysuria and frequency.  Neurological: Negative for dizziness, light-headedness, numbness and headaches.    History Past Medical History:  Diagnosis Date  . Anemia   . Anxiety   . Cataract   . Hyperlipidemia     She has a past surgical history that includes Tubal ligation (1991) and Colonoscopy.   Her family history includes Diabetes in her mother; Heart attack in her maternal grandfather; Heart disease in her maternal grandfather.She reports that she quit smoking about 15 years ago. Her smoking use included Cigarettes. She has never used smokeless tobacco. She reports that she drinks about 4.2 oz of alcohol per week . She reports that she does not use drugs.  Current Outpatient Prescriptions on File Prior to Visit  Medication Sig Dispense Refill  . ALPRAZolam (XANAX) 0.5 MG tablet TAKE 1 TABLET BY MOUTH DAILY AS NEEDED 30 tablet 2  . ciclopirox (PENLAC) 8 % solution APPLY OVER NAIL & SKIN AT BEDTIME. APPLY OVER PREVIOUS COAT, AFTER 7 DAYS MAY REMOVE, CONTINUE CYCLE 6.6 mL 0  . simvastatin (ZOCOR) 20 MG tablet TAKE 1 TABLET BY MOUTH AT BEDTIME 90 tablet 1  . fluticasone (FLONASE) 50 MCG/ACT nasal spray Place 2 sprays into both nostrils daily. (Patient not taking:  Reported on 09/12/2016) 16 g 6  . levocetirizine (XYZAL) 5 MG tablet Take 1 tablet (5 mg total) by mouth every evening. (Patient not taking: Reported on 09/12/2016) 30 tablet 5   No current facility-administered medications on file prior to visit.      Objective:  Objective  Physical Exam  Constitutional: She is oriented to person, place, and time. She appears well-developed and well-nourished.  HENT:  Head: Normocephalic and atraumatic.  Eyes: Conjunctivae and EOM are normal.  Neck: Normal range of motion. Neck supple. No JVD present. Carotid bruit is not present. No thyromegaly present.  Cardiovascular: Normal rate, regular rhythm and normal heart sounds.   No murmur heard. Pulmonary/Chest: Effort normal and breath sounds normal. No respiratory distress. She has no wheezes. She has no rales. She exhibits no tenderness.  Musculoskeletal: She exhibits no edema.  Neurological: She is alert and oriented to person, place, and time.  Psychiatric: She has a normal mood and affect.  Nursing note and vitals reviewed.  BP (!) 172/82 (BP Location: Left Arm, Patient Position: Sitting, Cuff Size: Normal)   Pulse 65   Temp 98.2 F (36.8 C) (Oral)   Resp 16   Ht 5\' 4"  (1.626 m)   Wt 180 lb 9.6 oz (81.9 kg)   SpO2 97%   BMI 31.00 kg/m  Wt Readings from Last 3 Encounters:  09/12/16 180 lb 9.6 oz (81.9 kg)  06/06/16 165 lb (74.8 kg)  12/03/15 169 lb (76.7 kg)     Lab Results  Component Value Date   WBC 8.8 06/06/2016  HGB 14.7 06/06/2016   HCT 43.6 06/06/2016   PLT 338.0 06/06/2016   GLUCOSE 104 (H) 06/06/2016   CHOL 191 06/06/2016   TRIG 89.0 06/06/2016   HDL 55.80 06/06/2016   LDLDIRECT 208.0 08/27/2015   LDLCALC 117 (H) 06/06/2016   ALT 29 06/06/2016   AST 26 06/06/2016   NA 139 06/06/2016   K 3.8 06/06/2016   CL 102 06/06/2016   CREATININE 0.87 06/06/2016   BUN 10 06/06/2016   CO2 29 06/06/2016   TSH 0.98 06/06/2016    Dg Chest 2 View  Result Date:  07/15/2015 CLINICAL DATA:  Acute onset of left anterior chest pain. Initial encounter. EXAM: CHEST  2 VIEW COMPARISON:  Chest radiograph performed 07/09/2015 FINDINGS: The lungs are well-aerated. Mild peribronchial thickening is noted. There is no evidence of focal opacification, pleural effusion or pneumothorax. The heart is normal in size; the mediastinal contour is within normal limits. No acute osseous abnormalities are seen. IMPRESSION: Mild peribronchial thickening noted.  Lungs otherwise clear. Electronically Signed   By: Garald Balding M.D.   On: 07/15/2015 22:49     Assessment & Plan:  Plan  I have discontinued Ms. Mclouth's lisinopril-hydrochlorothiazide. I am also having her start on lisinopril-hydrochlorothiazide. Additionally, I am having her maintain her levocetirizine, fluticasone, ciclopirox, simvastatin, and ALPRAZolam.  Meds ordered this encounter  Medications  . lisinopril-hydrochlorothiazide (PRINZIDE,ZESTORETIC) 20-25 MG tablet    Sig: Take 1 tablet by mouth daily.    Dispense:  90 tablet    Refill:  1    Problem List Items Addressed This Visit      Unprioritized   Chest pain    Stress test Echo If pain returns-- go to ER      Relevant Orders   Myocardial Perfusion Imaging   Essential hypertension - Primary    Inc lisinopril Recheck 3 weeks      Relevant Medications   lisinopril-hydrochlorothiazide (PRINZIDE,ZESTORETIC) 20-25 MG tablet   Other Relevant Orders   ECHOCARDIOGRAM COMPLETE   Palpitations    2d echo Lab Results  Component Value Date   TSH 0.98 06/06/2016     Chemistry      Component Value Date/Time   NA 139 06/06/2016 1224   K 3.8 06/06/2016 1224   CL 102 06/06/2016 1224   CO2 29 06/06/2016 1224   BUN 10 06/06/2016 1224   CREATININE 0.87 06/06/2016 1224      Component Value Date/Time   CALCIUM 10.5 06/06/2016 1224   ALKPHOS 55 06/06/2016 1224   AST 26 06/06/2016 1224   ALT 29 06/06/2016 1224   BILITOT 0.4 06/06/2016 1224            Relevant Orders   EKG 12-Lead (Completed)   ECHOCARDIOGRAM COMPLETE   Myocardial Perfusion Imaging    Other Visit Diagnoses    Family history of early CAD       Relevant Orders   Myocardial Perfusion Imaging      Follow-up: Return in about 3 weeks (around 10/03/2016) for hypertension.  [place], DO    00 .

## 2016-09-12 NOTE — Assessment & Plan Note (Signed)
2d echo Lab Results  Component Value Date   TSH 0.98 06/06/2016     Chemistry      Component Value Date/Time   NA 139 06/06/2016 1224   K 3.8 06/06/2016 1224   CL 102 06/06/2016 1224   CO2 29 06/06/2016 1224   BUN 10 06/06/2016 1224   CREATININE 0.87 06/06/2016 1224      Component Value Date/Time   CALCIUM 10.5 06/06/2016 1224   ALKPHOS 55 06/06/2016 1224   AST 26 06/06/2016 1224   ALT 29 06/06/2016 1224   BILITOT 0.4 06/06/2016 1224

## 2016-09-12 NOTE — Patient Instructions (Signed)

## 2016-09-12 NOTE — Assessment & Plan Note (Signed)
Inc lisinopril Recheck 3 weeks

## 2016-09-12 NOTE — Telephone Encounter (Signed)
Patient scheduled.

## 2016-09-12 NOTE — Progress Notes (Signed)
Pre visit review using our clinic review tool, if applicable. No additional management support is needed unless otherwise documented below in the visit note. 

## 2016-10-10 ENCOUNTER — Ambulatory Visit: Payer: Managed Care, Other (non HMO) | Admitting: Family Medicine

## 2016-11-07 ENCOUNTER — Telehealth (HOSPITAL_COMMUNITY): Payer: Self-pay | Admitting: Family Medicine

## 2016-11-14 ENCOUNTER — Ambulatory Visit: Payer: Managed Care, Other (non HMO) | Admitting: Family Medicine

## 2016-11-16 NOTE — Telephone Encounter (Signed)
  11/07/2016 01:00 PM Phone (Outgoing) Kor[place], [place] (Self) [phone] (H)   Left Message - Called pt and lmsg for her to CB to get scheduled for Echo and Myoview Dr. Cheri Rous wanted her to have.    By Verdene Rio           Close PreviousNext

## 2016-12-19 ENCOUNTER — Ambulatory Visit (INDEPENDENT_AMBULATORY_CARE_PROVIDER_SITE_OTHER): Payer: Managed Care, Other (non HMO) | Admitting: Cardiovascular Disease

## 2016-12-19 ENCOUNTER — Encounter (INDEPENDENT_AMBULATORY_CARE_PROVIDER_SITE_OTHER): Payer: Self-pay

## 2016-12-19 ENCOUNTER — Encounter: Payer: Self-pay | Admitting: Cardiovascular Disease

## 2016-12-19 VITALS — BP 140/90 | HR 64 | Ht 64.0 in | Wt 184.0 lb

## 2016-12-19 DIAGNOSIS — R079 Chest pain, unspecified: Secondary | ICD-10-CM

## 2016-12-19 DIAGNOSIS — R002 Palpitations: Secondary | ICD-10-CM

## 2016-12-19 DIAGNOSIS — I10 Essential (primary) hypertension: Secondary | ICD-10-CM

## 2016-12-19 DIAGNOSIS — E785 Hyperlipidemia, unspecified: Secondary | ICD-10-CM

## 2016-12-19 MED ORDER — AMLODIPINE BESYLATE 5 MG PO TABS: 5.0000 mg | ORAL_TABLET | Freq: Every day | ORAL | 3 refills | Status: DC

## 2016-12-19 NOTE — Assessment & Plan Note (Signed)
History of essential hypertension blood pressure 110 172/82. She does check her blood pressure at home and has been higher more recently. She is a well aware of salt restriction. Her current medications include lisinopril and hydrochlorothiazide. I'm going to add amlodipine 5 mg a day, however keep a daily blood pressure log for 1 month and see Jean Blair back in follow-up.

## 2016-12-19 NOTE — Progress Notes (Signed)
12/19/2016 Jean Blair   02-Mar-1961  [id number]  Primary Physician Ann Held, DO Primary Cardiologist: Lorretta Harp MD Renae Gloss  HPI:  Jean Blair is a very pleasant 56 year old mildly overweight divorced African-American female mother of 2 children who works as a Media planner for [employer]. She was referred by Dr. Etter Sjogren for cardiovascular evaluation because of hypertension, atypical chest pain and palpitations. Her risk factors include only treated hypertension. She smokes an occasional cigar Glasses of wine a day as well as 2 glasses of hot tea. She has never had a heart attack or stroke. There is no family history. She gets chest pain when his times a day as well as palpitations which have been increasing in frequency and severity. She has had hypertension for last 18 months is aware source infection. She is going through perimenopause as well.   Current Outpatient Prescriptions  Medication Sig Dispense Refill  . ALPRAZolam (XANAX) 0.5 MG tablet TAKE 1 TABLET BY MOUTH DAILY AS NEEDED 30 tablet 2  . ciclopirox (PENLAC) 8 % solution APPLY OVER NAIL & SKIN AT BEDTIME. APPLY OVER PREVIOUS COAT, AFTER 7 DAYS MAY REMOVE, CONTINUE CYCLE 6.6 mL 0  . lisinopril-hydrochlorothiazide (PRINZIDE,ZESTORETIC) 20-25 MG tablet Take 1 tablet by mouth daily. 90 tablet 1  . simvastatin (ZOCOR) 20 MG tablet TAKE 1 TABLET BY MOUTH AT BEDTIME 90 tablet 1   No current facility-administered medications for this visit.     Allergies  Allergen Reactions  . Codeine     REACTION: itching    Social History   Social History  . Marital status: Single    Spouse name: N/A  . Number of children: N/A  . Years of education: N/A   Occupational History  . Not on file.   Social History Main Topics  . Smoking status: Former Smoker    Types: Cigarettes    Quit date: 11/18/2000  . Smokeless tobacco: Never Used  . Alcohol use 4.2 oz/week    7 Glasses of  wine per week  . Drug use: No  . Sexual activity: Not on file   Other Topics Concern  . Not on file   Social History Narrative  . No narrative on file     Review of Systems: General: negative for chills, fever, night sweats or weight changes.  Cardiovascular: negative for chest pain, dyspnea on exertion, edema, orthopnea, palpitations, paroxysmal nocturnal dyspnea or shortness of breath Dermatological: negative for rash Respiratory: negative for cough or wheezing Urologic: negative for hematuria Abdominal: negative for nausea, vomiting, diarrhea, bright red blood per rectum, melena, or hematemesis Neurologic: negative for visual changes, syncope, or dizziness All other systems reviewed and are otherwise negative except as noted above.    Blood pressure 140/90, pulse 64, height 5\' 4"  (1.626 m), weight 184 lb (83.5 kg), SpO2 98 %.  General appearance: alert and no distress Neck: no adenopathy, no carotid bruit, no JVD, supple, symmetrical, trachea midline and thyroid not enlarged, symmetric, no tenderness/mass/nodules Lungs: clear to auscultation bilaterally Heart: regular rate and rhythm, S1, S2 normal, no murmur, click, rub or gallop Extremities: extremities normal, atraumatic, no cyanosis or edema  EKG not performed today. EKG performed 09/12/16 revealed sinus rhythm at 60 without ST or T-wave changes.  ASSESSMENT AND PLAN:   Essential hypertension History of essential hypertension blood pressure 110 172/82. She does check her blood pressure at home and has been higher more recently. She is a well aware  of salt restriction. Her current medications include lisinopril and hydrochlorothiazide. I'm going to add amlodipine 5 mg a day, however keep a daily blood pressure log for 1 month and see Jean Blair back in follow-up.  Palpitations History of palpitations that have been increasing in frequency and severity. Will check thyroid function tests and place a 30 day event monitor  Chest  pain History of atypical chest pain requiring ER visits in the past. The pain occurs one to 2 times a day lasting for seconds at a time. She has minimal cardiac risk factors. I'm going to get a routine GXT.      Lorretta Harp MD FACP,FACC,FAHA, FSCAI 12/19/2016 2:26 PM

## 2016-12-19 NOTE — Assessment & Plan Note (Signed)
History of palpitations that have been increasing in frequency and severity. Will check thyroid function tests and place a 30 day event monitor

## 2016-12-19 NOTE — Assessment & Plan Note (Signed)
History of atypical chest pain requiring ER visits in the past. The pain occurs one to 2 times a day lasting for seconds at a time. She has minimal cardiac risk factors. I'm going to get a routine GXT.

## 2016-12-19 NOTE — Patient Instructions (Signed)
Medication Instructions: Your physician recommends that you continue on your current medications as directed. Please refer to the Current Medication list given to you today.  START Amlodipine 5 mg daily.   Labwork: Your physician recommends that you return for fasting lab work.   Testing/Procedures: Your physician has requested that you have an exercise tolerance test. For further information please visit [email or web address]. Please also follow instruction sheet, as given.  Your physician has requested that you have an echocardiogram. Echocardiography is a painless test that uses sound waves to create images of your heart. It provides your doctor with information about the size and shape of your heart and how well your heart's chambers and valves are working. This procedure takes approximately one hour. There are no restrictions for this procedure.  Your physician has recommended that you wear a 30 day event monitor. Event monitors are medical devices that record the heart's electrical activity. Doctors most often [place] these monitors to diagnose arrhythmias. Arrhythmias are problems with the speed or rhythm of the heartbeat. The monitor is a small, portable device. You can wear one while you do your normal daily activities. This is usually used to diagnose what is causing palpitations/syncope (passing out).  Follow-Up: Your physician recommends that you schedule a follow-up appointment in: 6-8 weeks with Dr. Gwenlyn Found.  Your physician recommends that you schedule a follow-up appointment in: 1 month with PharmD in [facility]. Your physician has requested that you regularly monitor and record your blood pressure readings at home. Please use the same machine at the same time of day to check your readings and record them to bring to your follow-up visit.  If you need a refill on your cardiac medications before your next appointment, please call your pharmacy.

## 2016-12-20 ENCOUNTER — Ambulatory Visit (INDEPENDENT_AMBULATORY_CARE_PROVIDER_SITE_OTHER): Payer: Managed Care, Other (non HMO)

## 2016-12-20 DIAGNOSIS — R079 Chest pain, unspecified: Secondary | ICD-10-CM | POA: Diagnosis not present

## 2016-12-20 DIAGNOSIS — R002 Palpitations: Secondary | ICD-10-CM

## 2017-01-05 ENCOUNTER — Other Ambulatory Visit: Payer: Self-pay

## 2017-01-05 ENCOUNTER — Ambulatory Visit (HOSPITAL_COMMUNITY): Payer: Managed Care, Other (non HMO) | Attending: Cardiovascular Disease

## 2017-01-05 DIAGNOSIS — R079 Chest pain, unspecified: Secondary | ICD-10-CM | POA: Insufficient documentation

## 2017-01-05 DIAGNOSIS — I5189 Other ill-defined heart diseases: Secondary | ICD-10-CM | POA: Insufficient documentation

## 2017-01-05 DIAGNOSIS — I253 Aneurysm of heart: Secondary | ICD-10-CM | POA: Insufficient documentation

## 2017-01-05 DIAGNOSIS — R002 Palpitations: Secondary | ICD-10-CM | POA: Insufficient documentation

## 2017-01-10 ENCOUNTER — Telehealth (HOSPITAL_COMMUNITY): Payer: Self-pay

## 2017-01-10 NOTE — Telephone Encounter (Signed)
Encounter complete. 

## 2017-01-11 ENCOUNTER — Ambulatory Visit (INDEPENDENT_AMBULATORY_CARE_PROVIDER_SITE_OTHER): Payer: Managed Care, Other (non HMO) | Admitting: Pharmacist Clinician (PhC)/ Clinical Pharmacy Specialist

## 2017-01-11 ENCOUNTER — Ambulatory Visit (HOSPITAL_COMMUNITY)
Admission: RE | Admit: 2017-01-11 | Discharge: 2017-01-11 | Disposition: A | Discharge status: Home / Self Care | Payer: Managed Care, Other (non HMO) | Source: Ambulatory Visit | Attending: Cardiology | Admitting: Cardiology

## 2017-01-11 DIAGNOSIS — R002 Palpitations: Secondary | ICD-10-CM | POA: Diagnosis not present

## 2017-01-11 DIAGNOSIS — I10 Essential (primary) hypertension: Secondary | ICD-10-CM

## 2017-01-11 DIAGNOSIS — R079 Chest pain, unspecified: Secondary | ICD-10-CM | POA: Insufficient documentation

## 2017-01-11 LAB — EXERCISE TOLERANCE TEST
CSEPED: 12 min
CSEPEW: 13.7 METS
Exercise duration (sec): 10 s
MPHR: 167 {beats}/min
Peak HR: 173 {beats}/min
Percent HR: 103 %
RPE: 15
Rest HR: 67 {beats}/min

## 2017-01-11 MED ORDER — CHLORTHALIDONE 25 MG PO TABS: 25.0000 mg | ORAL_TABLET | Freq: Every day | ORAL | 3 refills | Status: DC

## 2017-01-11 NOTE — Assessment & Plan Note (Signed)
Patient with essential hypertension, well controlled in the office today, despite higher readings from home.  She is not happy with her current medications, wondering if the lisinopril/hctz is the cause of her foggy morning brain.  Will have her d/c that and start chlorthalidone 25 mg once daily.  She is aware that the symptom may or may not be related to that combination, and thus may not necessarily improve.  She will also d/c the amlodipine today and restart next Thursday with just 1/2 tablet daily.  Will see her back in 4 weeks, when she has an appointment with Dr. Gwenlyn Found.

## 2017-01-11 NOTE — Progress Notes (Signed)
01/11/2017 Jean Blair August 26, 1960 [id number]   HPI:  Jean Blair is a 56 y.o. female patient of Dr Gwenlyn Found, with a PMH below who presents today for hypertension clinic evaluation.  She saw Dr. Gwenlyn Found earlier this month on referral from Dr. Etter Sjogren.  She was complaining of palpitations and atypical chest pain in addition to hypertension.  He has arranged for her to wear a 30 day monitor and routine stress test, in addition to checking her thyroid function.  She has not yet been to the lab.    Today she states she has had hypertension since becoming perimenopausal, and notes that since Dr. Etter Sjogren doubled her lisinopril hctz from 10/12.5 to 20/25 mg daily she has felt like her brain is foggy/spongy for the first hour or so of each day.  Her BP was still elevated when she saw Dr. Gwenlyn Found and he started her on amlodipine 5 mg.  Today she notes some swelling in her ankles and calves, not every day, but enough to bother her.    Blood Pressure Goal:  130/80   Current Medications:  Lisinopril/hydrochlorothiazide 20/25 qd  Amlodipine 5 mg qd  Family Hx:  Grandmother MI at 19, hypertension, palpitations, died at 19  Mother no known cardiac history - not good relationship  No knowledge of father  2 kids 38, 75 - no signs of hypertension or heart disease  Social Hx:  Smokes the occasional cigar; has tried to cut out alcohol (wine) altogether; drinks 1 cup of hot tea each morning Diet:  No added salt, no seasoning salts, no processed foods; does eat in restaurants while travelling with work  [employer], no sodas or sweet tea  Exercise:  Gym 2-4 times per week; does zumba and other classes  Home BP readings:  Verified with Dr. Nonda Lou office within last 4-6 months.  14 readings since seeing Dr. Gwenlyn Found.  Average 140/84  Intolerances:  Amlodipine 5 mg caused LEE  Wt Readings from Last 3 Encounters:  12/19/16 184 lb (83.5 kg)  09/12/16 180 lb 9.6 oz (81.9 kg)  06/06/16 165 lb (74.8  kg)   BP Readings from Last 3 Encounters:  01/11/17 118/78  12/19/16 140/90  09/12/16 (!) 172/82   Pulse Readings from Last 3 Encounters:  01/11/17 68  12/19/16 64  09/12/16 65    Current Outpatient Prescriptions  Medication Sig Dispense Refill  . ALPRAZolam (XANAX) 0.5 MG tablet TAKE 1 TABLET BY MOUTH DAILY AS NEEDED 30 tablet 2  . amLODipine (NORVASC) 5 MG tablet Take 1 tablet (5 mg total) by mouth daily. 180 tablet 3  . chlorthalidone (HYGROTON) 25 MG tablet Take 1 tablet (25 mg total) by mouth daily. 30 tablet 3  . ciclopirox (PENLAC) 8 % solution APPLY OVER NAIL & SKIN AT BEDTIME. APPLY OVER PREVIOUS COAT, AFTER 7 DAYS MAY REMOVE, CONTINUE CYCLE 6.6 mL 0  . simvastatin (ZOCOR) 20 MG tablet TAKE 1 TABLET BY MOUTH AT BEDTIME 90 tablet 1   No current facility-administered medications for this visit.     Allergies  Allergen Reactions  . Codeine     REACTION: itching    Past Medical History:  Diagnosis Date  . Anemia   . Anxiety   . Cataract   . Hyperlipidemia     Blood pressure 118/78, pulse 68.  Essential hypertension Patient with essential hypertension, well controlled in the office today, despite higher readings from home.  She is not happy with her current medications, wondering if  the lisinopril/hctz is the cause of her foggy morning brain.  Will have her d/c that and start chlorthalidone 25 mg once daily.  She is aware that the symptom may or may not be related to that combination, and thus may not necessarily improve.  She will also d/c the amlodipine today and restart next Thursday with just 1/2 tablet daily.  Will see her back in 4 weeks, when she has an appointment with Dr. Gwenlyn Found.   Tommy Medal PharmD CPP [facility]

## 2017-01-11 NOTE — Patient Instructions (Signed)
Return for a a follow up appointment in 3-4 weeks  Your blood pressure today is 118/78  Check your blood pressure at home daily and keep record of the readings.  Take your BP meds as follows:  Stop lisinopril/hctz and amlodipine  Start chlorthalidone 25 mg once daily in the mornings  After 1 week, restart the amlodipine at 1/2 tablet daily (2.5 mg)  Bring all of your meds, your BP cuff and your record of home blood pressures to your next appointment.  Exercise as you're able, try to walk approximately 30 minutes per day.  Keep salt intake to a minimum, especially watch canned and prepared boxed foods.  Eat more fresh fruits and vegetables and fewer canned items.  Avoid eating in fast food restaurants.    HOW TO TAKE YOUR BLOOD PRESSURE: . Rest 5 minutes before taking your blood pressure. .  Don't smoke or drink caffeinated beverages for at least 30 minutes before. . Take your blood pressure before (not after) you eat. . Sit comfortably with your back supported and both feet on the floor (don't cross your legs). . Elevate your arm to heart level on a table or a desk. . Use the proper sized cuff. It should fit smoothly and snugly around your bare upper arm. There should be enough room to slip a fingertip under the cuff. The bottom edge of the cuff should be 1 inch above the crease of the elbow. . Ideally, take 3 measurements at one sitting and record the average.

## 2017-02-07 ENCOUNTER — Ambulatory Visit (INDEPENDENT_AMBULATORY_CARE_PROVIDER_SITE_OTHER): Payer: Managed Care, Other (non HMO) | Admitting: Cardiovascular Disease

## 2017-02-07 ENCOUNTER — Encounter: Payer: Self-pay | Admitting: Cardiovascular Disease

## 2017-02-07 ENCOUNTER — Ambulatory Visit: Payer: Managed Care, Other (non HMO) | Admitting: Pharmacist Clinician (PhC)/ Clinical Pharmacy Specialist

## 2017-02-07 DIAGNOSIS — I10 Essential (primary) hypertension: Secondary | ICD-10-CM

## 2017-02-07 DIAGNOSIS — R002 Palpitations: Secondary | ICD-10-CM | POA: Diagnosis not present

## 2017-02-07 DIAGNOSIS — I200 Unstable angina: Secondary | ICD-10-CM | POA: Diagnosis not present

## 2017-02-07 LAB — LIPID PANEL
CHOL/HDL RATIO: 5.1 ratio — AB (ref 0.0–4.4)
CHOLESTEROL TOTAL: 235 mg/dL — AB (ref 100–199)
HDL: 46 mg/dL (ref >39)
LDL CALC: 122 mg/dL — AB (ref 0–99)
TRIGLYCERIDES: 335 mg/dL — AB (ref 0–149)
VLDL Cholesterol Cal: 67 mg/dL — ABNORMAL HIGH (ref 5–40)

## 2017-02-07 LAB — BASIC METABOLIC PANEL
BUN / CREAT RATIO: 15 (ref 9–23)
BUN: 14 mg/dL (ref 6–24)
CALCIUM: 10.3 mg/dL — AB (ref 8.7–10.2)
CHLORIDE: 96 mmol/L (ref 96–106)
CO2: 25 mmol/L (ref 20–29)
CREATININE: 0.95 mg/dL (ref 0.57–1.00)
GFR, EST AFRICAN AMERICAN: 79 mL/min/{1.73_m2} (ref >59)
GFR, EST NON AFRICAN AMERICAN: 68 mL/min/{1.73_m2} (ref >59)
Glucose: 103 mg/dL — ABNORMAL HIGH (ref 65–99)
Potassium: 3.9 mmol/L (ref 3.5–5.2)
Sodium: 139 mmol/L (ref 134–144)

## 2017-02-07 LAB — HEPATIC FUNCTION PANEL
ALBUMIN: 4.6 g/dL (ref 3.5–5.5)
ALT: 23 IU/L (ref 0–32)
AST: 21 IU/L (ref 0–40)
Alkaline Phosphatase: 69 IU/L (ref 39–117)
BILIRUBIN TOTAL: 0.4 mg/dL (ref 0.0–1.2)
BILIRUBIN, DIRECT: 0.11 mg/dL (ref 0.00–0.40)
Total Protein: 7.4 g/dL (ref 6.0–8.5)

## 2017-02-07 LAB — T4, FREE: Free T4: 1.1 ng/dL (ref 0.82–1.77)

## 2017-02-07 LAB — TSH: TSH: 2.01 u[IU]/mL (ref 0.450–4.500)

## 2017-02-07 NOTE — Assessment & Plan Note (Signed)
History of essential hypertension blood pressure measured 140/90. It had been lower than this when seen by Cyril Mourning, our Pharm D. Her lisinopril / hydrochlorothiazide was discontinued because of side effects and she was begun on chlorthalidone 25 mg a day. Her previous symptoms of "fogginess" have improved although her blood pressure is creeping up as well.

## 2017-02-07 NOTE — Assessment & Plan Note (Signed)
History of palpitations with recent event monitor that showed essentially sinus rhythm without arrhythmia. Her palpitations have resolved.

## 2017-02-07 NOTE — Progress Notes (Signed)
02/07/2017 Jean Blair   [birth year]  [id number]  Primary Physician Jean Held, DO Primary Cardiologist: Jean Harp MD Jean Blair  HPI:  Jean Blair is a very pleasant 56 year old mildly overweight divorced African-American female mother of 2 children who works as a Media planner for [employer]. She was referred by Dr. Etter Blair for cardiovascular evaluation because of hypertension, atypical chest pain and palpitations. I last saw her in the office 12/19/16. Her risk factors include only treated hypertension. She smokes an occasional cigar Glasses of wine a day as well as 2 glasses of hot tea. She has never had a heart attack or stroke. There is no family history. She gets chest pain when his times a day as well as palpitations which have been increasing in frequency and severity. She has had hypertension for last 18 months is aware source infection. She is going through perimenopause as well. I perform routine GXT on her which was entirely normal as was a 2-D echocardiogram. An event monitor showed sinus rhythm without arrhythmia. Because of side effects from her lisinopril/Hydrocort thiazide, this was changed to chlorthalidone. Her "fogginess" has resolved although her blood pressure has slowly crept up.    Current Outpatient Prescriptions  Medication Sig Dispense Refill  . ALPRAZolam (XANAX) 0.5 MG tablet TAKE 1 TABLET BY MOUTH DAILY AS NEEDED 30 tablet 2  . chlorthalidone (HYGROTON) 25 MG tablet Take 25 mg by mouth daily.    . ciclopirox (PENLAC) 8 % solution APPLY OVER NAIL & SKIN AT BEDTIME. APPLY OVER PREVIOUS COAT, AFTER 7 DAYS MAY REMOVE, CONTINUE CYCLE 6.6 mL 0  . simvastatin (ZOCOR) 20 MG tablet TAKE 1 TABLET BY MOUTH AT BEDTIME 90 tablet 1   No current facility-administered medications for this visit.     Allergies  Allergen Reactions  . Codeine     REACTION: itching    Social History   Social History  . Marital status:  Single    Spouse name: N/A  . Number of children: N/A  . Years of education: N/A   Occupational History  . Not on file.   Social History Main Topics  . Smoking status: Former Smoker    Types: Cigarettes    Quit date: 11/18/2000  . Smokeless tobacco: Never Used  . Alcohol use 4.2 oz/week    7 Glasses of wine per week  . Drug use: No  . Sexual activity: Not on file   Other Topics Concern  . Not on file   Social History Narrative  . No narrative on file     Review of Systems: General: negative for chills, fever, night sweats or weight changes.  Cardiovascular: negative for chest pain, dyspnea on exertion, edema, orthopnea, palpitations, paroxysmal nocturnal dyspnea or shortness of breath Dermatological: negative for rash Respiratory: negative for cough or wheezing Urologic: negative for hematuria Abdominal: negative for nausea, vomiting, diarrhea, bright red blood per rectum, melena, or hematemesis Neurologic: negative for visual changes, syncope, or dizziness All other systems reviewed and are otherwise negative except as noted above.    Blood pressure (!) 140/94, pulse 77, height 5\' 4"  (1.626 m), weight 180 lb (81.6 kg).  General appearance: alert and no distress Neck: no adenopathy, no carotid bruit, no JVD, supple, symmetrical, trachea midline and thyroid not enlarged, symmetric, no tenderness/mass/nodules Lungs: clear to auscultation bilaterally Heart: regular rate and rhythm, S1, S2 normal, no murmur, click, rub or gallop Extremities: extremities normal, atraumatic, no cyanosis or  edema  EKG not performed today  ASSESSMENT AND PLAN:   Essential hypertension History of essential hypertension blood pressure measured 140/90. It had been lower than this when seen by Jean Blair, our Pharm D. Her lisinopril / hydrochlorothiazide was discontinued because of side effects and she was begun on chlorthalidone 25 mg a day. Her previous symptoms of "fogginess" have improved although  her blood pressure is creeping up as well.  Palpitations History of palpitations with recent event monitor that showed essentially sinus rhythm without arrhythmia. Her palpitations have resolved.  Chest pain History of atypical chest pain with a recent negative GXT and 2-D echocardiogram. Her chest pain has resolved      Jean Harp MD [facility], [facility] 02/07/2017 10:46 AM

## 2017-02-07 NOTE — Patient Instructions (Signed)

## 2017-02-07 NOTE — Assessment & Plan Note (Signed)
History of atypical chest pain with a recent negative GXT and 2-D echocardiogram. Her chest pain has resolved

## 2017-02-08 ENCOUNTER — Other Ambulatory Visit: Payer: Self-pay | Admitting: Cardiovascular Disease

## 2017-02-08 DIAGNOSIS — E785 Hyperlipidemia, unspecified: Secondary | ICD-10-CM

## 2017-02-08 MED ORDER — ATORVASTATIN CALCIUM 40 MG PO TABS: 40.0000 mg | ORAL_TABLET | Freq: Every day | ORAL | 2 refills | Status: DC

## 2017-03-14 ENCOUNTER — Ambulatory Visit: Payer: Managed Care, Other (non HMO)

## 2017-03-21 ENCOUNTER — Ambulatory Visit: Payer: Managed Care, Other (non HMO)

## 2017-03-23 ENCOUNTER — Ambulatory Visit: Payer: Self-pay

## 2017-03-28 ENCOUNTER — Other Ambulatory Visit: Payer: Self-pay | Admitting: *Deleted

## 2017-03-28 MED ORDER — CHLORTHALIDONE 25 MG PO TABS: 25.0000 mg | ORAL_TABLET | Freq: Every day | ORAL | 2 refills | Status: DC

## 2017-04-12 ENCOUNTER — Ambulatory Visit: Payer: Self-pay

## 2017-04-12 NOTE — Progress Notes (Deleted)
     04/12/2017 Jean Blair 1960-09-25 [id number]   HPI:  Jean Blair is a 56 y.o. female patient of Dr Gwenlyn Found, with a PMH below who presents today for hypertension clinic evaluation.  She saw Dr. Gwenlyn Found earlier this month on referral from Dr. Etter Sjogren.  She was complaining of palpitations and atypical chest pain in addition to hypertension.  When I saw her she was complaining of a fuzzy feeling from her medications.  I stopped her lisinopril hctz and started her on chlorthalidone.    Today she states she has had hypertension since becoming perimenopausal, and notes that since Dr. Etter Sjogren doubled her lisinopril hctz from 10/12.5 to 20/25 mg daily she has felt like her brain is foggy/spongy for the first hour or so of each day.  Her BP was still elevated when she saw Dr. Gwenlyn Found and he started her on amlodipine 5 mg.  Today she notes some swelling in her ankles and calves, not every day, but enough to bother her.    Blood Pressure Goal:  130/80   Current Medications:  Lisinopril/hydrochlorothiazide 20/25 qd  Amlodipine 5 mg qd  Family Hx:  Grandmother MI at 56, hypertension, palpitations, died at 25  Mother no known cardiac history - not good relationship  No knowledge of father  2 kids 51, 74 - no signs of hypertension or heart disease  Social Hx:  Smokes the occasional cigar; has tried to cut out alcohol (wine) altogether; drinks 1 cup of hot tea each morning Diet:  No added salt, no seasoning salts, no processed foods; does eat in restaurants while travelling with work  [employer], no sodas or sweet tea  Exercise:  Gym 2-4 times per week; does zumba and other classes  Home BP readings:  Verified with Dr. Nonda Lou office within last 4-6 months.  14 readings since seeing Dr. Gwenlyn Found.  Average 140/84  Intolerances:  Amlodipine 5 mg caused LEE  Wt Readings from Last 3 Encounters:  02/07/17 180 lb (81.6 kg)  12/19/16 184 lb (83.5 kg)  09/12/16 180 lb 9.6 oz (81.9 kg)   BP  Readings from Last 3 Encounters:  02/07/17 (!) 140/94  01/11/17 118/78  12/19/16 140/90   Pulse Readings from Last 3 Encounters:  02/07/17 77  01/11/17 68  12/19/16 64    Current Outpatient Prescriptions  Medication Sig Dispense Refill  . ALPRAZolam (XANAX) 0.5 MG tablet TAKE 1 TABLET BY MOUTH DAILY AS NEEDED 30 tablet 2  . atorvastatin (LIPITOR) 40 MG tablet Take 1 tablet (40 mg total) by mouth daily. 30 tablet 2  . chlorthalidone (HYGROTON) 25 MG tablet Take 1 tablet (25 mg total) by mouth daily. 90 tablet 2  . ciclopirox (PENLAC) 8 % solution APPLY OVER NAIL & SKIN AT BEDTIME. APPLY OVER PREVIOUS COAT, AFTER 7 DAYS MAY REMOVE, CONTINUE CYCLE 6.6 mL 0   No current facility-administered medications for this visit.     Allergies  Allergen Reactions  . Codeine     REACTION: itching    Past Medical History:  Diagnosis Date  . Anemia   . Anxiety   . Cataract   . Hyperlipidemia     There were no vitals taken for this visit.  No problem-specific Assessment & Plan notes found for this encounter.   Tommy Medal PharmD CPP [facility]

## 2017-05-22 ENCOUNTER — Other Ambulatory Visit: Payer: Self-pay

## 2017-05-22 MED ORDER — ATORVASTATIN CALCIUM 40 MG PO TABS: 40.0000 mg | ORAL_TABLET | Freq: Every day | ORAL | 2 refills | Status: DC

## 2017-05-23 ENCOUNTER — Other Ambulatory Visit: Payer: Self-pay | Admitting: *Deleted

## 2017-05-23 MED ORDER — ATORVASTATIN CALCIUM 40 MG PO TABS: 40.0000 mg | ORAL_TABLET | Freq: Every day | ORAL | 2 refills | Status: DC

## 2017-07-24 LAB — HM PAP SMEAR

## 2017-11-09 ENCOUNTER — Telehealth: Payer: Self-pay

## 2017-11-09 ENCOUNTER — Encounter: Payer: Self-pay | Admitting: Family Medicine

## 2017-11-09 ENCOUNTER — Ambulatory Visit (INDEPENDENT_AMBULATORY_CARE_PROVIDER_SITE_OTHER): Payer: 59 | Admitting: Family Medicine

## 2017-11-09 VITALS — BP 142/88 | HR 74 | Resp 16 | Ht 64.0 in | Wt 177.0 lb

## 2017-11-09 DIAGNOSIS — R8290 Unspecified abnormal findings in urine: Secondary | ICD-10-CM | POA: Diagnosis not present

## 2017-11-09 DIAGNOSIS — D229 Melanocytic nevi, unspecified: Secondary | ICD-10-CM

## 2017-11-09 DIAGNOSIS — M25562 Pain in left knee: Secondary | ICD-10-CM

## 2017-11-09 DIAGNOSIS — Z0000 Encounter for general adult medical examination without abnormal findings: Secondary | ICD-10-CM

## 2017-11-09 DIAGNOSIS — B351 Tinea unguium: Secondary | ICD-10-CM

## 2017-11-09 DIAGNOSIS — R319 Hematuria, unspecified: Secondary | ICD-10-CM

## 2017-11-09 DIAGNOSIS — Z0001 Encounter for general adult medical examination with abnormal findings: Secondary | ICD-10-CM | POA: Diagnosis not present

## 2017-11-09 DIAGNOSIS — M67442 Ganglion, left hand: Secondary | ICD-10-CM | POA: Diagnosis not present

## 2017-11-09 DIAGNOSIS — B009 Herpesviral infection, unspecified: Secondary | ICD-10-CM

## 2017-11-09 LAB — POC URINALSYSI DIPSTICK (AUTOMATED)
BILIRUBIN UA: NEGATIVE
Glucose, UA: NEGATIVE
KETONES UA: NEGATIVE
Leukocytes, UA: NEGATIVE
Nitrite, UA: NEGATIVE
PROTEIN UA: NEGATIVE
SPEC GRAV UA: 1.015 (ref 1.010–1.025)
Urobilinogen, UA: 1 E.U./dL
pH, UA: 6.5 (ref 5.0–8.0)

## 2017-11-09 LAB — TSH: TSH: 2.01 mIU/L

## 2017-11-09 MED ORDER — CICLOPIROX 8 % EX SOLN: CUTANEOUS | 0 refills | Status: DC

## 2017-11-09 NOTE — Progress Notes (Signed)
Subjective:     Jean Blair is a 57 y.o. female and is here for a comprehensive physical exam. The patient reports problems with L knee pain laterally / numbness.  No known injury.-- symptoms x 3 weeks but getting worse.     Social History   Socioeconomic History  . Marital status: Single    Spouse name: Not on file  . Number of children: Not on file  . Years of education: Not on file  . Highest education level: Not on file  Occupational History  . Not on file  Social Needs  . Financial resource strain: Not on file  . Food insecurity:    Worry: Not on file    Inability: Not on file  . Transportation needs:    Medical: Not on file    Non-medical: Not on file  Tobacco Use  . Smoking status: Former Smoker    Types: Cigarettes    Last attempt to quit: 11/18/2000    Years since quitting: 16.9  . Smokeless tobacco: Never Used  Substance and Sexual Activity  . Alcohol use: Yes    Alcohol/week: 4.2 oz    Types: 7 Glasses of wine per week  . Drug use: No  . Sexual activity: Not on file  Lifestyle  . Physical activity:    Days per week: Not on file    Minutes per session: Not on file  . Stress: Not on file  Relationships  . Social connections:    Talks on phone: Not on file    Gets together: Not on file    Attends religious service: Not on file    Active member of club or organization: Not on file    Attends meetings of clubs or organizations: Not on file    Relationship status: Not on file  . Intimate partner violence:    Fear of current or ex partner: Not on file    Emotionally abused: Not on file    Physically abused: Not on file    Forced sexual activity: Not on file  Other Topics Concern  . Not on file  Social History Narrative  . Not on file   Health Maintenance  Topic Date Due  . COLONOSCOPY  12/04/2017  . INFLUENZA VACCINE  02/14/2018  . MAMMOGRAM  08/17/2018  . PAP SMEAR  08/17/2020  . DEXA SCAN  03/06/2021  . TETANUS/TDAP  06/06/2026  . Hepatitis  C Screening  Completed  . HIV Screening  Completed    The following portions of the patient's history were reviewed and updated as appropriate:  She  has a past medical history of Anemia, Anxiety, Cataract, and Hyperlipidemia. She does not have any pertinent problems on file. She  has a past surgical history that includes Tubal ligation (1991) and Colonoscopy. Her family history includes Alcohol abuse in her unknown relative; Anxiety disorder in her unknown relative; Cancer in her brother; Diabetes in her mother and unknown relative; Heart attack in her maternal grandfather; Heart disease in her maternal grandfather; Hypertension in her unknown relative; Mental illness in her unknown relative; Stroke in her unknown relative. She  reports that she quit smoking about 16 years ago. Her smoking use included cigarettes. She has never used smokeless tobacco. She reports that she drinks about 4.2 oz of alcohol per week. She reports that she does not use drugs. She has a current medication list which includes the following prescription(s): alprazolam, atorvastatin, chlorthalidone, and ciclopirox. Current Outpatient Medications on File Prior to  Visit  Medication Sig Dispense Refill  . ALPRAZolam (XANAX) 0.5 MG tablet TAKE 1 TABLET BY MOUTH DAILY AS NEEDED 30 tablet 2  . atorvastatin (LIPITOR) 40 MG tablet Take 1 tablet (40 mg total) daily by mouth. 90 tablet 2  . chlorthalidone (HYGROTON) 25 MG tablet Take 1 tablet (25 mg total) by mouth daily. 90 tablet 2   No current facility-administered medications on file prior to visit.    She is allergic to codeine..  Review of Systems Review of Systems  Constitutional: Negative for activity change, appetite change and fatigue.  HENT: Negative for hearing loss, congestion, tinnitus and ear discharge.  dentist q31m Eyes: Negative for visual disturbance (see optho q1y -- vision corrected to 20/20 with glasses).  Respiratory: Negative for cough, chest tightness  and shortness of breath.   Cardiovascular: Negative for chest pain, palpitations and leg swelling.  Gastrointestinal: Negative for abdominal pain, diarrhea, constipation and abdominal distention.  Genitourinary: Negative for urgency, frequency, decreased urine volume and difficulty urinating.  Musculoskeletal: Negative for back pain, arthralgias and gait problem.  Skin: Negative for color change, pallor and rash.  Neurological: Negative for dizziness, light-headedness, numbness and headaches.  Hematological: Negative for adenopathy. Does not bruise/bleed easily.  Psychiatric/Behavioral: Negative for suicidal ideas, confusion, sleep disturbance, self-injury, dysphoric mood, decreased concentration and agitation.      Objective:    BP (!) 142/88 (BP Location: Right Arm, Patient Position: Sitting, Cuff Size: Large)   Pulse 74   Resp 16   Ht 5\' 4"  (1.626 m)   Wt 177 lb (80.3 kg)   SpO2 99%   BMI 30.38 kg/m  General appearance: alert, cooperative, appears stated age and no distress Head: Normocephalic, without obvious abnormality, atraumatic Eyes: negative findings: lids and lashes normal and pupils equal, round, reactive to light and accomodation Ears: normal TM's and external ear canals both ears Nose: Nares normal. Septum midline. Mucosa normal. No drainage or sinus tenderness. Throat: lips, mucosa, and tongue normal; teeth and gums normal Neck: no adenopathy, no carotid bruit, no JVD, supple, symmetrical, trachea midline and thyroid not enlarged, symmetric, no tenderness/mass/nodules Back: symmetric, no curvature. ROM normal. No CVA tenderness. Lungs: clear to auscultation bilaterally Breasts: normal appearance, no masses or tenderness Heart: regular rate and rhythm, S1, S2 normal, no murmur, click, rub or gallop Abdomen: soft, non-tender; bowel sounds normal; no masses,  no organomegaly Pelvic: deferred Extremities: extremities normal, atraumatic, no cyanosis or edema Pulses: 2+  and symmetric Skin: Skin color, texture, turgor normal. No rashes or lesions Lymph nodes: Cervical, supraclavicular, and axillary nodes normal. Neurologic: Alert and oriented X 3, normal strength and tone. Normal symmetric reflexes. Normal coordination and gait    Assessment:    Healthy female exam.      Plan:    see avs Check labs See After Visit Summary for Counseling Recommendations    1. Preventative health care See above - CBC with Differential/Platelet - Lipid panel - Comprehensive metabolic panel - TSH - POCT Urinalysis Dipstick (Automated)  2. Acute pain of left knee New problem - Ambulatory referral to Sports Medicine  3. Suspicious nevus Refer to derm for skin check - Ambulatory referral to Dermatology  4. Toenail fungus  - ciclopirox (PENLAC) 8 % solution; APPLY OVER NAIL & SKIN AT BEDTIME. APPLY OVER PREVIOUS COAT, AFTER 7 DAYS MAY REMOVE, CONTINUE CYCLE  Dispense: 6.6 mL; Refill: 0  5. Ganglion cyst of flexor tendon sheath of finger, left Refer to hand surgeon  - Ambulatory referral to  Orthopedic Surgery  6. Herpes infection  - HSV 1/2 Ab (IgM), IFA w/rflx Titer  7. Urine abnormality  - Urine Culture  8. Hematuria, unspecified type  - Urine Culture

## 2017-11-09 NOTE — Patient Instructions (Signed)
Preventive Care 40-64 Years, Female Preventive care refers to lifestyle choices and visits with your health care provider that can promote health and wellness. What does preventive care include?  A yearly physical exam. This is also called an annual well check.  Dental exams once or twice a year.  Routine eye exams. Ask your health care provider how often you should have your eyes checked.  Personal lifestyle choices, including: ? Daily care of your teeth and gums. ? Regular physical activity. ? Eating a healthy diet. ? Avoiding tobacco and drug use. ? Limiting alcohol use. ? Practicing safe sex. ? Taking low-dose aspirin daily starting at age 58. ? Taking vitamin and mineral supplements as recommended by your health care provider. What happens during an annual well check? The services and screenings done by your health care provider during your annual well check will depend on your age, overall health, lifestyle risk factors, and family history of disease. Counseling Your health care provider may ask you questions about your:  Alcohol use.  Tobacco use.  Drug use.  Emotional well-being.  Home and relationship well-being.  Sexual activity.  Eating habits.  Work and work Statistician.  Method of birth control.  Menstrual cycle.  Pregnancy history.  Screening You may have the following tests or measurements:  Height, weight, and BMI.  Blood pressure.  Lipid and cholesterol levels. These may be checked every 5 years, or more frequently if you are over 81 years old.  Skin check.  Lung cancer screening. You may have this screening every year starting at age 78 if you have a 30-pack-year history of smoking and currently smoke or have quit within the past 15 years.  Fecal occult blood test (FOBT) of the stool. You may have this test every year starting at age 65.  Flexible sigmoidoscopy or colonoscopy. You may have a sigmoidoscopy every 5 years or a colonoscopy  every 10 years starting at age 30.  Hepatitis C blood test.  Hepatitis B blood test.  Sexually transmitted disease (STD) testing.  Diabetes screening. This is done by checking your blood sugar (glucose) after you have not eaten for a while (fasting). You may have this done every 1-3 years.  Mammogram. This may be done every 1-2 years. Talk to your health care provider about when you should start having regular mammograms. This may depend on whether you have a family history of breast cancer.  BRCA-related cancer screening. This may be done if you have a family history of breast, ovarian, tubal, or peritoneal cancers.  Pelvic exam and Pap test. This may be done every 3 years starting at age 80. Starting at age 36, this may be done every 5 years if you have a Pap test in combination with an HPV test.  Bone density scan. This is done to screen for osteoporosis. You may have this scan if you are at high risk for osteoporosis.  Discuss your test results, treatment options, and if necessary, the need for more tests with your health care provider. Vaccines Your health care provider may recommend certain vaccines, such as:  Influenza vaccine. This is recommended every year.  Tetanus, diphtheria, and acellular pertussis (Tdap, Td) vaccine. You may need a Td booster every 10 years.  Varicella vaccine. You may need this if you have not been vaccinated.  Zoster vaccine. You may need this after age 5.  Measles, mumps, and rubella (MMR) vaccine. You may need at least one dose of MMR if you were born in  1955 or later. You may also need a second dose.  Pneumococcal 13-valent conjugate (PCV13) vaccine. You may need this if you have certain conditions and were not previously vaccinated.  Pneumococcal polysaccharide (PPSV23) vaccine. You may need one or two doses if you smoke cigarettes or if you have certain conditions.  Meningococcal vaccine. You may need this if you have certain  conditions.  Hepatitis A vaccine. You may need this if you have certain conditions or if you travel or work in places where you may be exposed to hepatitis A.  Hepatitis B vaccine. You may need this if you have certain conditions or if you travel or work in places where you may be exposed to hepatitis B.  Haemophilus influenzae type b (Hib) vaccine. You may need this if you have certain conditions.  Talk to your health care provider about which screenings and vaccines you need and how often you need them. This information is not intended to replace advice given to you by your health care provider. Make sure you discuss any questions you have with your health care provider. Document Released: 07/30/2015 Document Revised: 03/22/2016 Document Reviewed: 05/04/2015 Elsevier Interactive Patient Education  2018 [employer] Inc[employer]  

## 2017-11-09 NOTE — Telephone Encounter (Signed)
PA initiated via Covermymeds; KEY: [id number]. Received real time PA approval. Effective 10/10/2017 through 11/09/2018.

## 2017-11-10 LAB — CBC WITH DIFFERENTIAL/PLATELET
BASOS ABS: 33 {cells}/uL (ref 0–200)
BASOS PCT: 0.5 %
EOS ABS: 152 {cells}/uL (ref 15–500)
EOS PCT: 2.3 %
HEMATOCRIT: 40.5 % (ref 35.0–45.0)
Hemoglobin: 14.3 g/dL (ref 11.7–15.5)
Lymphs Abs: 2270 cells/uL (ref 850–3900)
MCH: 30.6 pg (ref 27.0–33.0)
MCHC: 35.3 g/dL (ref 32.0–36.0)
MCV: 86.5 fL (ref 80.0–100.0)
MPV: 10.2 fL (ref 7.5–12.5)
Monocytes Relative: 7 %
NEUTROS ABS: 3683 {cells}/uL (ref 1500–7800)
Neutrophils Relative %: 55.8 %
Platelets: 319 10*3/uL (ref 140–400)
RBC: 4.68 10*6/uL (ref 3.80–5.10)
RDW: 12 % (ref 11.0–15.0)
Total Lymphocyte: 34.4 %
WBC mixed population: 462 cells/uL (ref 200–950)
WBC: 6.6 10*3/uL (ref 3.8–10.8)

## 2017-11-10 LAB — COMPREHENSIVE METABOLIC PANEL
AG Ratio: 1.7 (calc) (ref 1.0–2.5)
ALBUMIN MSPROF: 4.7 g/dL (ref 3.6–5.1)
ALKALINE PHOSPHATASE (APISO): 50 U/L (ref 33–130)
ALT: 29 U/L (ref 6–29)
AST: 21 U/L (ref 10–35)
BUN: 17 mg/dL (ref 7–25)
CALCIUM: 10.7 mg/dL — AB (ref 8.6–10.4)
CO2: 29 mmol/L (ref 20–32)
CREATININE: 0.86 mg/dL (ref 0.50–1.05)
Chloride: 99 mmol/L (ref 98–110)
Globulin: 2.7 g/dL (calc) (ref 1.9–3.7)
Glucose, Bld: 96 mg/dL (ref 65–99)
Potassium: 3.7 mmol/L (ref 3.5–5.3)
Sodium: 138 mmol/L (ref 135–146)
Total Bilirubin: 0.6 mg/dL (ref 0.2–1.2)
Total Protein: 7.4 g/dL (ref 6.1–8.1)

## 2017-11-10 LAB — LIPID PANEL
CHOL/HDL RATIO: 3.9 (calc) (ref <5.0)
Cholesterol: 205 mg/dL — ABNORMAL HIGH (ref <200)
HDL: 53 mg/dL (ref >50)
LDL CHOLESTEROL (CALC): 133 mg/dL — AB
NON-HDL CHOLESTEROL (CALC): 152 mg/dL — AB (ref <130)
TRIGLYCERIDES: 90 mg/dL (ref <150)

## 2017-11-10 LAB — URINE CULTURE
MICRO NUMBER:: 90512641
Result:: NO GROWTH
SPECIMEN QUALITY: ADEQUATE

## 2017-11-13 ENCOUNTER — Other Ambulatory Visit: Payer: Self-pay

## 2017-11-13 DIAGNOSIS — E785 Hyperlipidemia, unspecified: Secondary | ICD-10-CM

## 2017-11-13 DIAGNOSIS — I10 Essential (primary) hypertension: Secondary | ICD-10-CM

## 2017-11-13 LAB — HSV 1/2 AB (IGM), IFA W/RFLX TITER
HSV 1 IgM Screen: NEGATIVE
HSV 2 IGM SCREEN: NEGATIVE

## 2017-11-19 ENCOUNTER — Ambulatory Visit: Payer: 59 | Admitting: Family Medicine

## 2017-11-19 ENCOUNTER — Encounter: Payer: Self-pay | Admitting: Family Medicine

## 2017-11-19 DIAGNOSIS — M25562 Pain in left knee: Secondary | ICD-10-CM | POA: Diagnosis not present

## 2017-11-19 NOTE — Patient Instructions (Signed)
Your history and exam are consistent with a small lateral meniscus tear. This should heal with conservative treatment without needing surgery. Avoid deep squats, lunges, leg press, activities that require bending your knee 90 degrees. Icing 15 minutes at a time 3-4 times a day as needed. Aleve 2 tabs twice a day with food for pain and inflammation - typically take for 7-10 days then as needed. Elevate only if needed for swelling. Straight leg raises, knee extensions 3 sets of 10 once a day should help build strength without aggravating the meniscus. We can put in an order for physical therapy downstairs if you would like - let us know. Follow up with me in 1 month to 6 weeks for reevaluation. An injection is an option but we prefer not to do this for this issue.

## 2017-11-20 ENCOUNTER — Encounter: Payer: Self-pay | Admitting: Family Medicine

## 2017-11-20 DIAGNOSIS — M25562 Pain in left knee: Secondary | ICD-10-CM | POA: Insufficient documentation

## 2017-11-20 NOTE — Progress Notes (Signed)
PCP and consultation requested by: Ann Held, DO  Subjective:   HPI: Patient is a 57 y.o. female here for left knee pain.  Patient reports about 3-4 weeks ago she was getting up into bed - put knee down to get up there and felt a sharp shooting pain lateral left knee. Pain 0/10 at rest. Pain only with certain movements or with kneeling now. Does well with zumba, walking. Tried heat, topical ointment. Pain still lateral and can be sharp. She did have stiffness previously. No skin changes, numbness.  Past Medical History:  Diagnosis Date  . Anemia   . Anxiety   . Cataract   . Hyperlipidemia     Current Outpatient Medications on File Prior to Visit  Medication Sig Dispense Refill  . ALPRAZolam (XANAX) 0.5 MG tablet TAKE 1 TABLET BY MOUTH DAILY AS NEEDED 30 tablet 2  . atorvastatin (LIPITOR) 40 MG tablet Take 1 tablet (40 mg total) daily by mouth. 90 tablet 2  . chlorthalidone (HYGROTON) 25 MG tablet Take 1 tablet (25 mg total) by mouth daily. 90 tablet 2  . ciclopirox (PENLAC) 8 % solution APPLY OVER NAIL & SKIN AT BEDTIME. APPLY OVER PREVIOUS COAT, AFTER 7 DAYS MAY REMOVE, CONTINUE CYCLE 6.6 mL 0   No current facility-administered medications on file prior to visit.     Past Surgical History:  Procedure Laterality Date  . COLONOSCOPY    . TUBAL LIGATION  1991    Allergies  Allergen Reactions  . Codeine     REACTION: itching    Social History   Socioeconomic History  . Marital status: Single    Spouse name: Not on file  . Number of children: Not on file  . Years of education: Not on file  . Highest education level: Not on file  Occupational History  . Not on file  Social Needs  . Financial resource strain: Not on file  . Food insecurity:    Worry: Not on file    Inability: Not on file  . Transportation needs:    Medical: Not on file    Non-medical: Not on file  Tobacco Use  . Smoking status: Former Smoker    Types: Cigarettes    Last attempt  to quit: 11/18/2000    Years since quitting: 17.0  . Smokeless tobacco: Never Used  Substance and Sexual Activity  . Alcohol use: Yes    Alcohol/week: 4.2 oz    Types: 7 Glasses of wine per week  . Drug use: No  . Sexual activity: Not on file  Lifestyle  . Physical activity:    Days per week: Not on file    Minutes per session: Not on file  . Stress: Not on file  Relationships  . Social connections:    Talks on phone: Not on file    Gets together: Not on file    Attends religious service: Not on file    Active member of club or organization: Not on file    Attends meetings of clubs or organizations: Not on file    Relationship status: Not on file  . Intimate partner violence:    Fear of current or ex partner: Not on file    Emotionally abused: Not on file    Physically abused: Not on file    Forced sexual activity: Not on file  Other Topics Concern  . Not on file  Social History Narrative  . Not on file    Family History  Problem Relation Age of Onset  . Diabetes Mother   . Anxiety disorder Unknown   . Diabetes Unknown   . Hypertension Unknown   . Mental illness Unknown   . Alcohol abuse Unknown   . Stroke Unknown   . Heart disease Maternal Grandfather   . Heart attack Maternal Grandfather   . Cancer Brother        bladder  . Colon cancer Neg Hx     BP (!) 159/91   Pulse 62   Ht 5\' 4"  (1.626 m)   Wt 177 lb (80.3 kg)   BMI 30.38 kg/m   Review of Systems: See HPI above.     Objective:  Physical Exam:  Gen: NAD, comfortable in exam room  Left knee: No gross deformity, ecchymoses, swelling. TTP lateral joint line.  No other tenderness. FROM with 5/5 strength flexion/extension. Negative ant/post drawers. Negative valgus/varus testing. Negative lachmanns. Negative mcmurrays, apleys, patellar apprehension. NV intact distally.  Right knee: No deformity. FROM with 5/5 strength. No tenderness to palpation. NVI distally.   Assessment & Plan:  1. Left  knee pain - consistent with likely small lateral meniscus tear but exam is reassuring.  Discussed activities to avoid.  Aleve for 7-10 days then as needed. Shown home exercises to do daily.  F/u in 1 month to 6 weeks.  Consider physical therapy, injection, MRI depending on how she's doing.

## 2017-11-20 NOTE — Assessment & Plan Note (Signed)
consistent with likely small lateral meniscus tear but exam is reassuring.  Discussed activities to avoid.  Aleve for 7-10 days then as needed. Shown home exercises to do daily.  F/u in 1 month to 6 weeks.  Consider physical therapy, injection, MRI depending on how she's doing.

## 2017-11-29 ENCOUNTER — Encounter: Payer: Self-pay | Admitting: Family Medicine

## 2017-11-29 ENCOUNTER — Encounter: Payer: Self-pay | Admitting: Gastroenterology

## 2017-12-13 ENCOUNTER — Encounter: Payer: Self-pay | Admitting: Family Medicine

## 2017-12-13 ENCOUNTER — Ambulatory Visit: Payer: 59 | Admitting: Family Medicine

## 2017-12-13 VITALS — BP 142/82 | HR 72 | Temp 98.7°F | Resp 18 | Wt 177.8 lb

## 2017-12-13 DIAGNOSIS — R21 Rash and other nonspecific skin eruption: Secondary | ICD-10-CM | POA: Diagnosis not present

## 2017-12-13 NOTE — Patient Instructions (Signed)
Rash A rash is a change in the color of the skin. A rash can also change the way your skin feels. There are many different conditions and factors that can cause a rash. Follow these instructions at home: Pay attention to any changes in your symptoms. Follow these instructions to help with your condition: Medicine Take or apply over-the-counter and prescription medicines only as told by your health care provider. These may include:  Corticosteroid cream.  Anti-itch lotions.  Oral antihistamines.  Skin Care  Apply cool compresses to the affected areas.  Try taking a bath with: ? Epsom salts. Follow the instructions on the packaging. You can get these at your local pharmacy or grocery store. ? Baking soda. Pour a small amount into the bath as told by your health care provider. ? Colloidal oatmeal. Follow the instructions on the packaging. You can get this at your local pharmacy or grocery store.  Try applying baking soda paste to your skin. Stir water into baking soda until it reaches a paste-like consistency.  Do not scratch or rub your skin.  Avoid covering the rash. Make sure the rash is exposed to air as much as possible. General instructions  Avoid hot showers or baths, which can make itching worse. A cold shower may help.  Avoid scented soaps, detergents, and perfumes. Use gentle soaps, detergents, perfumes, and other cosmetic products.  Avoid any substance that causes your rash. Keep a journal to help track what causes your rash. Write down: ? What you eat. ? What cosmetic products you use. ? What you drink. ? What you wear. This includes jewelry.  Keep all follow-up visits as told by your health care provider. This is important. Contact a health care provider if:  You sweat at night.  You lose weight.  You urinate more than normal.  You feel weak.  You vomit.  Your skin or the whites of your eyes look yellow (jaundice).  Your skin: ? Tingles. ? Is  numb.  Your rash: ? Does not go away after several days. ? Gets worse.  You are: ? Unusually thirsty. ? More tired than normal.  You have: ? New symptoms. ? Pain in your abdomen. ? A fever. ? Diarrhea. Get help right away if:  You develop a rash that covers all or most of your body. The rash may or may not be painful.  You develop blisters that: ? Are on top of the rash. ? Grow larger or grow together. ? Are painful. ? Are inside your nose or mouth.  You develop a rash that: ? Looks like purple pinprick-sized spots all over your body. ? Has a "bull's eye" or looks like a target. ? Is not related to sun exposure, is red and painful, and causes your skin to peel. This information is not intended to replace advice given to you by your health care provider. Make sure you discuss any questions you have with your health care provider. Document Released: 06/23/2002 Document Revised: 12/07/2015 Document Reviewed: 11/18/2014 Elsevier Interactive Patient Education  2018 [employer]  

## 2017-12-13 NOTE — Assessment & Plan Note (Signed)
Suspect pityriasis  Can take otc antihistanine prn It is not bothering her so she prefers to do nothing rto if worsens

## 2017-12-13 NOTE — Progress Notes (Signed)
Subjective:  I acted as a Education administrator for [employer]. Jean Blair, [name]   Patient ID: Jean Blair, female    DOB: [birth year], 57 y.o.   MRN: [id number]  Chief Complaint  Patient presents with  . Rash    3 dayds    HPI  Patient is in today for rash on upper torso area.  Rash is not itchy.  Etiology unknown .  No new soaps ,lotions, detergents , makeup, etc.  It doesn't bother pt other then she noticed it and she wanted to make sure it was nothing to worry about .    No fever,  Patient Care Team: Carollee Herter, Alferd Apa, DO as PCP - General Arvella Nigh, MD as Consulting Physician (Obstetrics and Gynecology) Lorretta Harp, MD as Consulting Physician (Cardiology)   Past Medical History:  Diagnosis Date  . Anemia   . Anxiety   . Cataract   . Hyperlipidemia     Past Surgical History:  Procedure Laterality Date  . COLONOSCOPY    . TUBAL LIGATION  1991    Family History  Problem Relation Age of Onset  . Diabetes Mother   . Anxiety disorder Unknown   . Diabetes Unknown   . Hypertension Unknown   . Mental illness Unknown   . Alcohol abuse Unknown   . Stroke Unknown   . Heart disease Maternal Grandfather   . Heart attack Maternal Grandfather   . Cancer Brother        bladder  . Colon cancer Neg Hx     Social History   Socioeconomic History  . Marital status: Single    Spouse name: Not on file  . Number of children: Not on file  . Years of education: Not on file  . Highest education level: Not on file  Occupational History  . Not on file  Social Needs  . Financial resource strain: Not on file  . Food insecurity:    Worry: Not on file    Inability: Not on file  . Transportation needs:    Medical: Not on file    Non-medical: Not on file  Tobacco Use  . Smoking status: Former Smoker    Types: Cigarettes    Last attempt to quit: 11/18/2000    Years since quitting: 17.0  . Smokeless tobacco: Never Used  Substance and Sexual Activity  . Alcohol use: Yes   Alcohol/week: 4.2 oz    Types: 7 Glasses of wine per week  . Drug use: No  . Sexual activity: Not on file  Lifestyle  . Physical activity:    Days per week: Not on file    Minutes per session: Not on file  . Stress: Not on file  Relationships  . Social connections:    Talks on phone: Not on file    Gets together: Not on file    Attends religious service: Not on file    Active member of club or organization: Not on file    Attends meetings of clubs or organizations: Not on file    Relationship status: Not on file  . Intimate partner violence:    Fear of current or ex partner: Not on file    Emotionally abused: Not on file    Physically abused: Not on file    Forced sexual activity: Not on file  Other Topics Concern  . Not on file  Social History Narrative  . Not on file    Outpatient Medications Prior to Visit  Medication Sig Dispense Refill  . ALPRAZolam (XANAX) 0.5 MG tablet TAKE 1 TABLET BY MOUTH DAILY AS NEEDED 30 tablet 2  . chlorthalidone (HYGROTON) 25 MG tablet Take 1 tablet (25 mg total) by mouth daily. 90 tablet 2  . ciclopirox (PENLAC) 8 % solution APPLY OVER NAIL & SKIN AT BEDTIME. APPLY OVER PREVIOUS COAT, AFTER 7 DAYS MAY REMOVE, CONTINUE CYCLE 6.6 mL 0  . atorvastatin (LIPITOR) 40 MG tablet Take 1 tablet (40 mg total) daily by mouth. 90 tablet 2   No facility-administered medications prior to visit.     Allergies  Allergen Reactions  . Codeine     REACTION: itching    Review of Systems  Constitutional: Negative for chills, fever and malaise/fatigue.  HENT: Negative for congestion and hearing loss.   Eyes: Negative for discharge.  Respiratory: Negative for cough, sputum production and shortness of breath.   Cardiovascular: Negative for chest pain, palpitations and leg swelling.  Gastrointestinal: Negative for abdominal pain, blood in stool, constipation, diarrhea, heartburn, nausea and vomiting.  Genitourinary: Negative for dysuria, frequency, hematuria  and urgency.  Musculoskeletal: Negative for back pain, falls and myalgias.  Skin: Negative for rash.  Neurological: Negative for dizziness, sensory change, loss of consciousness, weakness and headaches.  Endo/Heme/Allergies: Negative for environmental allergies. Does not bruise/bleed easily.  Psychiatric/Behavioral: Negative for depression and suicidal ideas. The patient is not nervous/anxious and does not have insomnia.        Objective:    Physical Exam  Constitutional: She is oriented to person, place, and time. She appears well-developed and well-nourished.  HENT:  Head: Normocephalic and atraumatic.  Eyes: Conjunctivae and EOM are normal.  Neck: Normal range of motion. Neck supple. No JVD present. Carotid bruit is not present. No thyromegaly present.  Cardiovascular: Normal rate, regular rhythm and normal heart sounds.  No murmur heard. Pulmonary/Chest: Effort normal and breath sounds normal. No respiratory distress. She has no wheezes. She has no rales. She exhibits no tenderness.  Musculoskeletal: She exhibits no edema.  Neurological: She is alert and oriented to person, place, and time.  Skin: Rash noted.     Psychiatric: She has a normal mood and affect.  Nursing note and vitals reviewed.   BP (!) 142/82 (BP Location: Left Arm, Patient Position: Sitting, Cuff Size: Normal)   Pulse 72   Temp 98.7 F (37.1 C) (Oral)   Resp 18   Wt 177 lb 12.8 oz (80.6 kg)   SpO2 97%   BMI 30.52 kg/m  Wt Readings from Last 3 Encounters:  12/13/17 177 lb 12.8 oz (80.6 kg)  11/19/17 177 lb (80.3 kg)  11/09/17 177 lb (80.3 kg)   BP Readings from Last 3 Encounters:  12/13/17 (!) 142/82  11/19/17 (!) 159/91  11/09/17 (!) 142/88     Immunization History  Administered Date(s) Administered  . Tdap 06/06/2016    Health Maintenance  Topic Date Due  . COLONOSCOPY  12/04/2017  . INFLUENZA VACCINE  02/14/2018  . MAMMOGRAM  08/17/2018  . PAP SMEAR  08/17/2020  . DEXA SCAN   03/06/2021  . TETANUS/TDAP  06/06/2026  . Hepatitis C Screening  Completed  . HIV Screening  Completed    Lab Results  Component Value Date   WBC 6.6 11/09/2017   HGB 14.3 11/09/2017   HCT 40.5 11/09/2017   PLT 319 11/09/2017   GLUCOSE 96 11/09/2017   CHOL 205 (H) 11/09/2017   TRIG 90 11/09/2017   HDL 53 11/09/2017   LDLDIRECT 208.0  08/27/2015   LDLCALC 133 (H) 11/09/2017   ALT 29 11/09/2017   AST 21 11/09/2017   NA 138 11/09/2017   K 3.7 11/09/2017   CL 99 11/09/2017   CREATININE 0.86 11/09/2017   BUN 17 11/09/2017   CO2 29 11/09/2017   TSH 2.01 11/09/2017    Lab Results  Component Value Date   TSH 2.01 11/09/2017   Lab Results  Component Value Date   WBC 6.6 11/09/2017   HGB 14.3 11/09/2017   HCT 40.5 11/09/2017   MCV 86.5 11/09/2017   PLT 319 11/09/2017   Lab Results  Component Value Date   NA 138 11/09/2017   K 3.7 11/09/2017   CO2 29 11/09/2017   GLUCOSE 96 11/09/2017   BUN 17 11/09/2017   CREATININE 0.86 11/09/2017   BILITOT 0.6 11/09/2017   ALKPHOS 69 02/07/2017   AST 21 11/09/2017   ALT 29 11/09/2017   PROT 7.4 11/09/2017   ALBUMIN 4.6 02/07/2017   CALCIUM 10.7 (H) 11/09/2017   ANIONGAP 6 07/15/2015   GFR 87.46 06/06/2016   Lab Results  Component Value Date   CHOL 205 (H) 11/09/2017   Lab Results  Component Value Date   HDL 53 11/09/2017   Lab Results  Component Value Date   LDLCALC 133 (H) 11/09/2017   Lab Results  Component Value Date   TRIG 90 11/09/2017   Lab Results  Component Value Date   CHOLHDL 3.9 11/09/2017   No results found for: HGBA1C       Assessment & Plan:   Problem List Items Addressed This Visit      Unprioritized   Rash - Primary    Suspect pityriasis  Can take otc antihistanine prn It is not bothering her so she prefers to do nothing rto if worsens          I am having [name] maintain her ALPRAZolam, chlorthalidone, atorvastatin, and ciclopirox.  No orders of the defined types  were placed in this encounter.   CMA served as Education administrator during this visit. History, Physical and Plan performed by medical provider. Documentation and orders reviewed and attested to.  Ann Held, DO

## 2017-12-17 ENCOUNTER — Ambulatory Visit: Payer: 59 | Admitting: Family Medicine

## 2017-12-27 ENCOUNTER — Ambulatory Visit: Payer: 59 | Admitting: Family Medicine

## 2018-01-07 ENCOUNTER — Other Ambulatory Visit: Payer: Self-pay | Admitting: Cardiovascular Disease

## 2018-01-07 NOTE — Telephone Encounter (Signed)
Rx request sent to pharmacy.  

## 2018-06-06 LAB — HM MAMMOGRAPHY

## 2018-07-01 ENCOUNTER — Other Ambulatory Visit: Payer: Self-pay | Admitting: Cardiovascular Disease

## 2018-07-01 NOTE — Telephone Encounter (Signed)
Rx request sent to pharmacy.  

## 2018-07-15 ENCOUNTER — Other Ambulatory Visit: Payer: Self-pay | Admitting: Cardiovascular Disease

## 2018-07-16 ENCOUNTER — Other Ambulatory Visit: Payer: Self-pay | Admitting: Cardiovascular Disease

## 2018-08-13 ENCOUNTER — Encounter: Payer: Self-pay | Admitting: Family Medicine

## 2018-11-03 ENCOUNTER — Other Ambulatory Visit: Payer: Self-pay | Admitting: Cardiovascular Disease

## 2018-11-04 NOTE — Telephone Encounter (Signed)
Atorvastatin 40 mg refilled. 

## 2018-11-12 ENCOUNTER — Other Ambulatory Visit: Payer: Self-pay | Admitting: Cardiovascular Disease

## 2018-11-12 ENCOUNTER — Telehealth: Payer: Self-pay | Admitting: Cardiovascular Disease

## 2018-11-12 NOTE — Telephone Encounter (Signed)
Chlorthalidone 25 mg refilled.

## 2018-11-12 NOTE — Telephone Encounter (Signed)
LVM for patient to call and schedule appt

## 2018-11-25 ENCOUNTER — Other Ambulatory Visit: Payer: Self-pay | Admitting: Cardiovascular Disease

## 2018-11-26 MED ORDER — CHLORTHALIDONE 25 MG PO TABS: 25.0000 mg | ORAL_TABLET | Freq: Every day | ORAL | 0 refills | Status: DC

## 2018-11-26 NOTE — Addendum Note (Signed)
Addended byBarry Brunner on: 11/26/2018 08:55 AM   Modules accepted: Orders

## 2018-12-17 ENCOUNTER — Other Ambulatory Visit: Payer: Self-pay | Admitting: Cardiovascular Disease

## 2018-12-17 NOTE — Telephone Encounter (Signed)
° ° ° °*  STAT* If patient is at the pharmacy, call can be transferred to refill team.   1. Which medications need to be refilled? (please list name of each medication and dose if known) chlorthalidone (HYGROTON) 25 MG tablet, atorvastatin (LIPITOR) 40 MG tablet  2. Which pharmacy/location (including street and city if local pharmacy) is medication to be sent to? [id number] - [place], [place] [address]  3. Do they need a 30 day or 90 day supply? [place]

## 2018-12-19 MED ORDER — CHLORTHALIDONE 25 MG PO TABS: 25.0000 mg | ORAL_TABLET | Freq: Every day | ORAL | 0 refills | Status: DC

## 2018-12-19 MED ORDER — ATORVASTATIN CALCIUM 40 MG PO TABS: ORAL_TABLET | ORAL | 0 refills | Status: DC

## 2018-12-27 ENCOUNTER — Telehealth: Payer: Self-pay | Admitting: Cardiovascular Disease

## 2018-12-30 NOTE — Telephone Encounter (Signed)
LVM for pre reg & sent message to my chart / called x3 for pre reg & sent email

## 2019-01-01 NOTE — Telephone Encounter (Signed)
patient returned call/ smartphone/ consent/ my chart/ pre reg completed °

## 2019-01-02 ENCOUNTER — Other Ambulatory Visit: Payer: Self-pay

## 2019-01-02 ENCOUNTER — Telehealth: Payer: Self-pay

## 2019-01-02 ENCOUNTER — Telehealth (INDEPENDENT_AMBULATORY_CARE_PROVIDER_SITE_OTHER): Payer: 59 | Admitting: Cardiovascular Disease

## 2019-01-02 DIAGNOSIS — R030 Elevated blood-pressure reading, without diagnosis of hypertension: Secondary | ICD-10-CM

## 2019-01-02 DIAGNOSIS — R002 Palpitations: Secondary | ICD-10-CM | POA: Diagnosis not present

## 2019-01-02 DIAGNOSIS — I10 Essential (primary) hypertension: Secondary | ICD-10-CM

## 2019-01-02 DIAGNOSIS — E782 Mixed hyperlipidemia: Secondary | ICD-10-CM | POA: Diagnosis not present

## 2019-01-02 MED ORDER — AMLODIPINE BESYLATE 5 MG PO TABS: 5.0000 mg | ORAL_TABLET | Freq: Every day | ORAL | 3 refills | Status: DC

## 2019-01-02 NOTE — Telephone Encounter (Signed)
Patient and/or DPR-approved person aware of 6/18 AVS instructions and verbalized understanding. AVS TO BE RELEASED TO MYCHART.

## 2019-01-02 NOTE — Patient Instructions (Addendum)
Medication Instructions:  Your physician has recommended you make the following change in your medication:  START AMLODIPINE (NORVASC) 5 MG. ONE TABLET BY MOUTH DAILY  If you need a refill on your cardiac medications before your next appointment, please call your pharmacy.   Lab work: Your physician recommends that you return for lab work within 1-2 weeks. FASTING LIPID PROFILE. YOU WILL RECEIVE A LAB SLIP IN THE MAIL. PLEASE DO NOT EAT OR DRINK (EXCEPT WATER) ANYTHING AFTER MIDNIGHT ON THE DAY YOU CHOOSE TO PRESENT FOR LAB WORK. YOU MAY EAT AFTER YOUR BLOOD HAS BEEN COLLECTED. NO APPOINTMENT IS NEEDED.  If you have labs (blood work) drawn today and your tests are completely normal, you will receive your results only by: Marland Kitchen MyChart Message (if you have MyChart) OR . A paper copy in the mail If you have any lab test that is abnormal or we need to change your treatment, we will call you to review the results.  Testing/Procedures: NONE  Follow-Up: At [facility], you and your health needs are our priority.  As part of our continuing mission to provide you with exceptional heart care, we have created designated Provider Care Teams.  These Care Teams include your primary Cardiologist (physician) and Advanced Practice Providers (APPs -  Physician Assistants and Nurse Practitioners) who all work together to provide you with the care you need, when you need it. . You will need a follow up appointment in 12 months WITH DR. Gwenlyn Found.  Please call our office 2 months in advance to schedule this appointment.    Any Other Special Instructions Will Be Listed Below (If Applicable). KEEP A DAILY BLOOD PRESSURE LOG FOR 30 DAYS THEN FOLLOW UP WITH A CLINICAL PHARMACIST IN THE HYPERTENSION CLINIC. YOU WILL BE CONTACTED TO DISCUSS YOUR BLOOD PRESSURE READINGS.

## 2019-01-02 NOTE — Progress Notes (Signed)
Virtual Visit via Video Note   This visit type was conducted due to national recommendations for restrictions regarding the COVID-19 Pandemic (e.g. social distancing) in an effort to limit this patient's exposure and mitigate transmission in our community.  Due to her co-morbid illnesses, this patient is at least at moderate risk for complications without adequate follow up.  This format is felt to be most appropriate for this patient at this time.  All issues noted in this document were discussed and addressed.  A limited physical exam was performed with this format.  Please refer to the patient's chart for her consent to telehealth for [facility]   Date:  01/02/2019   ID:  Jean Blair, Jean Blair 07/13/1961, MRN [id number]  Patient Location: Home Provider Location: Home  PCP:  Jean Held, DO  Cardiologist: Dr. Quay Blair Electrophysiologist:  None   Evaluation Performed:  Follow-Up Visit  Chief Complaint: Hypertension, palpitations and atypical chest pain  History of Present Illness:    Ms. Jean Blair is a very pleasant 58 -year-old mildly overweight (has lost 20 pounds intentionally over the last 2 years) divorced African-American female mother of 2 children who works as a Media planner for [employer]. She was referred by Dr. Etter Blair for cardiovascular evaluation because of hypertension, atypical chest pain and palpitations. I last saw her in the office 02/07/2017. Her risk factors include only treated hypertension. She smokes an occasional cigar Glasses of wine a day as well as 2 glasses of hot tea. She has never had a heart attack or stroke. There is no family history. She gets chest pain when his times a day as well as palpitations which have been increasing in frequency and severity. She has had hypertension for last 18 months is aware source infection. She is going through perimenopause as well. I perform routine GXT on her which was entirely normal as  was a 2-D echocardiogram. An event monitor showed sinus rhythm without arrhythmia. Because of side effects from her lisinopril/Hydrocort thiazide, this was changed to chlorthalidone. Her "fogginess" has resolved although her blood pressure has slowly crept up.  Since I saw HER-58 years ago she has lost 20 pounds.  Her atypical chest pain and palpitations have completely resolved.  I obtained a routine GXT on her 01/11/2017 was which was normal.  2D echo revealed normal LV function with an atrial septal aneurysm and an event monitor was normal as well.  She is aware of salt restriction.  Her blood pressure slowly creeping up on chlorthalidone.   The patient does not have symptoms concerning for COVID-19 infection (fever, chills, cough, or new shortness of breath).    Past Medical History:  Diagnosis Date  . Anemia   . Anxiety   . Cataract   . Hyperlipidemia    Past Surgical History:  Procedure Laterality Date  . COLONOSCOPY    . TUBAL LIGATION  1991     No outpatient medications have been marked as taking for the 01/02/19 encounter (Appointment) with Jean Harp, MD.     Allergies:   Codeine   Social History   Tobacco Use  . Smoking status: Former Smoker    Types: Cigarettes    Quit date: 11/18/2000    Years since quitting: 18.1  . Smokeless tobacco: Never Used  Substance Use Topics  . Alcohol use: Yes    Alcohol/week: 7.0 standard drinks    Types: 7 Glasses of wine per week  . Drug use: No  Family Hx: The patient's family history includes Alcohol abuse in her unknown relative; Anxiety disorder in her unknown relative; Cancer in her brother; Diabetes in her mother and unknown relative; Heart attack in her maternal grandfather; Heart disease in her maternal grandfather; Hypertension in her unknown relative; Mental illness in her unknown relative; Stroke in her unknown relative. There is no history of Colon cancer.  ROS:   Please see the history of present illness.      All other systems reviewed and are negative.   Prior CV studies:   The following studies were reviewed today:  2D echo, Myoview stress test and event monitor  Labs/Other Tests and Data Reviewed:    EKG:  No ECG reviewed.  Recent Labs: No results found for requested labs within last 8760 hours.   Recent Lipid Panel Lab Results  Component Value Date/Time   CHOL 205 (H) 11/09/2017 03:49 PM   CHOL 235 (H) 02/07/2017 12:00 AM   TRIG 90 11/09/2017 03:49 PM   HDL 53 11/09/2017 03:49 PM   HDL 46 02/07/2017 12:00 AM   CHOLHDL 3.9 11/09/2017 03:49 PM   LDLCALC 133 (H) 11/09/2017 03:49 PM   LDLDIRECT 208.0 08/27/2015 11:29 AM    Wt Readings from Last 3 Encounters:  12/13/17 177 lb 12.8 oz (80.6 kg)  11/19/17 177 lb (80.3 kg)  11/09/17 177 lb (80.3 kg)     Objective:    Vital Signs:  There were no vitals taken for this visit.   VITAL SIGNS:  reviewed GEN:  no acute distress RESPIRATORY:  normal respiratory effort, symmetric expansion NEURO:  alert and oriented x 3, no obvious focal deficit PSYCH:  normal affect  ASSESSMENT & PLAN:    1. Essential hypertension- history of essential potential blood pressure measured by the patient today at 153/90 with pulse of 78.  She is on chlorthalidone.  She limits her salt intake.  I am going to begin her on amlodipine 5 mg a day and have her keep a blood pressure log for 30 days.  I will have Jean Blair call her back in 4 weeks to review make adjustments that that are necessary. 2. Hyperlipidemia- history of hyperlipidemia on atorvastatin with lipid profile performed 11/09/2017 revealing total cholesterol 205, LDL 122 and HDL 53 3. Atypical chest pain- resolved, negative GXT 01/11/2017 4. Palpitations- resolved, event monitor 12/20/2016 was entirely normal  COVID-19 Education: The signs and symptoms of COVID-19 were discussed with the patient and how to seek care for testing (follow up with PCP or arrange E-visit).  The importance of social  distancing was discussed today.  Time:   Today, I have spent 7 minutes with the patient with telehealth technology discussing the above problems.     Medication Adjustments/Labs and Tests Ordered: Current medicines are reviewed at length with the patient today.  Concerns regarding medicines are outlined above.   Tests Ordered: No orders of the defined types were placed in this encounter.   Medication Changes: No orders of the defined types were placed in this encounter.   Follow Up:  In Person in 1 year(s)  Signed, Jean Burow, MD  01/02/2019 7:50 AM    [place]

## 2019-01-17 ENCOUNTER — Other Ambulatory Visit: Payer: Self-pay | Admitting: Cardiovascular Disease

## 2019-01-20 MED ORDER — ATORVASTATIN CALCIUM 40 MG PO TABS: ORAL_TABLET | ORAL | 2 refills | Status: DC

## 2019-01-20 NOTE — Addendum Note (Signed)
Addended byBarry Brunner on: 01/20/2019 10:01 AM   Modules accepted: Orders

## 2019-02-04 ENCOUNTER — Encounter: Payer: Self-pay | Admitting: Obstetrics and Gynecology

## 2019-02-05 ENCOUNTER — Telehealth: Payer: Self-pay | Admitting: Family Medicine

## 2019-02-05 NOTE — Telephone Encounter (Signed)
Pt needs appointment, in office or virtual, before we can fill her Xanax. Thanks!

## 2019-02-05 NOTE — Telephone Encounter (Signed)
Medication Refill - Medication: ALPRAZolam (XANAX) 0.5 MG tablet and ciclopirox (PENLAC) 8 % solution    Preferred Pharmacy (with phone number or street name):  [id number] [address], [place] [phone] (Phone) [phone] (Fax)

## 2019-02-05 NOTE — Telephone Encounter (Signed)
Pt needs appointment, in office or virtual, before we can fill her Xanax. Thanks!  Called left msg for patient to call back and schd appt

## 2019-02-22 ENCOUNTER — Other Ambulatory Visit: Payer: Self-pay | Admitting: Cardiovascular Disease

## 2019-02-26 ENCOUNTER — Encounter: Payer: Self-pay | Admitting: Family Medicine

## 2019-02-26 ENCOUNTER — Telehealth: Payer: Self-pay

## 2019-02-26 ENCOUNTER — Ambulatory Visit (INDEPENDENT_AMBULATORY_CARE_PROVIDER_SITE_OTHER): Payer: 59 | Admitting: Family Medicine

## 2019-02-26 DIAGNOSIS — F419 Anxiety disorder, unspecified: Secondary | ICD-10-CM

## 2019-02-26 DIAGNOSIS — B351 Tinea unguium: Secondary | ICD-10-CM | POA: Diagnosis not present

## 2019-02-26 MED ORDER — CICLOPIROX 8 % EX SOLN: CUTANEOUS | 0 refills | Status: DC

## 2019-02-26 MED ORDER — ALPRAZOLAM 0.5 MG PO TABS: 0.5000 mg | ORAL_TABLET | Freq: Every day | ORAL | 2 refills | Status: DC | PRN

## 2019-02-26 NOTE — Telephone Encounter (Signed)
PA initiated via Covermymeds; KEY: [id number]. PA approved. Effective 02/26/2019 to 02/26/2020.

## 2019-02-26 NOTE — Progress Notes (Signed)
Virtual Visit via Video Note  I connected with Jean Blair on 02/26/19 at  8:40 AM EDT by a video enabled telemedicine application and verified that I am speaking with the correct person using two identifiers.  Location: Patient: home Provider: home    I discussed the limitations of evaluation and management by telemedicine and the availability of in person appointments. The patient expressed understanding and agreed to proceed.  History of Present Illness: Pt is home doing well.  Working from home and not going out except to grocery store.   Pt with no complaints.     Observations/Objective: Vitals:   02/26/19 0824  BP: 134/90  Resp: 18    Wt 156.6   Pt in NAD  Assessment and Plan: 1. Anxiety Stable  - ALPRAZolam (XANAX) 0.5 MG tablet; Take 1 tablet (0.5 mg total) by mouth daily as needed.  Dispense: 30 tablet; Refill: 2  2. Toenail fungus Refill penlac  Improving per pt  - ciclopirox (PENLAC) 8 % solution; APPLY OVER NAIL & SKIN AT BEDTIME. APPLY OVER PREVIOUS COAT, AFTER 7 DAYS MAY REMOVE, CONTINUE CYCLE  Dispense: 6.6 mL; Refill: 0   Follow Up Instructions:    I discussed the assessment and treatment plan with the patient. The patient was provided an opportunity to ask questions and all were answered. The patient agreed with the plan and demonstrated an understanding of the instructions.   The patient was advised to call back or seek an in-person evaluation if the symptoms worsen or if the condition fails to improve as anticipated.  I provided 15 minutes of non-face-to-face time during this encounter.   Ann Held, DO

## 2019-03-31 ENCOUNTER — Other Ambulatory Visit: Payer: Self-pay | Admitting: Cardiovascular Disease

## 2019-04-24 ENCOUNTER — Other Ambulatory Visit: Payer: Self-pay

## 2019-04-25 ENCOUNTER — Ambulatory Visit (INDEPENDENT_AMBULATORY_CARE_PROVIDER_SITE_OTHER): Payer: 59 | Admitting: Family Medicine

## 2019-04-25 ENCOUNTER — Encounter: Payer: Self-pay | Admitting: Family Medicine

## 2019-04-25 VITALS — BP 168/110 | HR 58 | Temp 96.5°F | Resp 17 | Ht 64.0 in | Wt 165.0 lb

## 2019-04-25 DIAGNOSIS — R1011 Right upper quadrant pain: Secondary | ICD-10-CM | POA: Diagnosis not present

## 2019-04-25 DIAGNOSIS — I10 Essential (primary) hypertension: Secondary | ICD-10-CM

## 2019-04-25 DIAGNOSIS — M546 Pain in thoracic spine: Secondary | ICD-10-CM | POA: Insufficient documentation

## 2019-04-25 DIAGNOSIS — Z0283 Encounter for blood-alcohol and blood-drug test: Secondary | ICD-10-CM | POA: Diagnosis not present

## 2019-04-25 MED ORDER — LOSARTAN POTASSIUM 50 MG PO TABS: 50.0000 mg | ORAL_TABLET | Freq: Every day | ORAL | 2 refills | Status: DC

## 2019-04-25 MED ORDER — OMEPRAZOLE 20 MG PO CPDR: 20.0000 mg | DELAYED_RELEASE_CAPSULE | Freq: Every day | ORAL | 3 refills | Status: DC

## 2019-04-25 MED ORDER — METHOCARBAMOL 500 MG PO TABS: 500.0000 mg | ORAL_TABLET | Freq: Four times a day (QID) | ORAL | 0 refills | Status: DC

## 2019-04-25 NOTE — Assessment & Plan Note (Signed)
Poorly controlled will alter medications, encouraged DASH diet, minimize caffeine and obtain adequate sleep. Report concerning symptoms and follow up as directed and as needed con't norvasc and chlorthalidone Add losartan 50 mg daily  Recheck 2-3 weeks

## 2019-04-25 NOTE — Progress Notes (Signed)
Patient ID: Jean Blair, female    DOB: March 22, 1961  Age: 58 y.o. MRN: [id number]    Subjective:  Subjective  HPI Jean Blair presents for pain between the shoulder blades on the right and inc heartburn.  No known injury.  She is taking aleve.  Symptoms x 4-5 days  No NVD,  No chest pain or sob.     Review of Systems  Constitutional: Negative for appetite change, diaphoresis, fatigue and unexpected weight change.  Eyes: Negative for pain, redness and visual disturbance.  Respiratory: Negative for cough, chest tightness, shortness of breath and wheezing.   Cardiovascular: Negative for chest pain, palpitations and leg swelling.  Endocrine: Negative for cold intolerance, heat intolerance, polydipsia, polyphagia and polyuria.  Genitourinary: Negative for difficulty urinating, dysuria and frequency.  Musculoskeletal: Positive for back pain. Negative for arthralgias, gait problem and myalgias.  Neurological: Negative for dizziness, light-headedness, numbness and headaches.    History Past Medical History:  Diagnosis Date  . Anemia   . Anxiety   . Cataract   . Hyperlipidemia     She has a past surgical history that includes Tubal ligation (1991) and Colonoscopy.   Her family history includes Alcohol abuse in her unknown relative; Anxiety disorder in her unknown relative; Cancer in her brother; Diabetes in her mother and unknown relative; Heart attack in her maternal grandfather; Heart disease in her maternal grandfather; Hypertension in her unknown relative; Mental illness in her unknown relative; Stroke in her unknown relative.She reports that she quit smoking about 18 years ago. Her smoking use included cigarettes. She has never used smokeless tobacco. She reports current alcohol use of about 7.0 standard drinks of alcohol per week. She reports that she does not use drugs.  Current Outpatient Medications on File Prior to Visit  Medication Sig Dispense Refill  . ALPRAZolam  (XANAX) 0.5 MG tablet Take 1 tablet (0.5 mg total) by mouth daily as needed. 30 tablet 2  . atorvastatin (LIPITOR) 40 MG tablet TAKE 1 TABLET (40 MG TOTAL) BY MOUTH DAILY. 90 tablet 2  . chlorthalidone (HYGROTON) 25 MG tablet TAKE 1 TABLET BY MOUTH EVERY DAY 30 tablet 4  . ciclopirox (PENLAC) 8 % solution APPLY OVER NAIL & SKIN AT BEDTIME. APPLY OVER PREVIOUS COAT, AFTER 7 DAYS MAY REMOVE, CONTINUE CYCLE 6.6 mL 0  . amLODipine (NORVASC) 5 MG tablet Take 1 tablet (5 mg total) by mouth daily. 90 tablet 3  . liothyronine (CYTOMEL) 5 MCG tablet Take 10 mcg by mouth daily.     No current facility-administered medications on file prior to visit.      Objective:  Objective  Physical Exam Vitals signs and nursing note reviewed.  Constitutional:      Appearance: She is well-developed.  HENT:     Head: Normocephalic and atraumatic.  Eyes:     Conjunctiva/sclera: Conjunctivae normal.  Neck:     Musculoskeletal: Normal range of motion and neck supple.     Thyroid: No thyromegaly.     Vascular: No carotid bruit or JVD.  Cardiovascular:     Rate and Rhythm: Normal rate and regular rhythm.     Heart sounds: Normal heart sounds. No murmur.  Pulmonary:     Effort: Pulmonary effort is normal. No respiratory distress.     Breath sounds: Normal breath sounds. No wheezing or rales.  Chest:     Chest wall: No tenderness.  Abdominal:     General: Abdomen is flat. Bowel sounds are normal. There is  no distension.     Palpations: Abdomen is soft.     Tenderness: There is abdominal tenderness in the epigastric area. There is no guarding or rebound. Negative signs include Murphy's sign.  Musculoskeletal:        General: Tenderness present.     Thoracic back: She exhibits tenderness, pain and spasm. She exhibits normal range of motion.       Back:  Neurological:     Mental Status: She is alert and oriented to person, place, and time.    BP (!) 168/110 (BP Location: Left Arm, Patient Position:  Sitting, Cuff Size: Normal)   Pulse (!) 58   Temp (!) 96.5 F (35.8 C) (Temporal)   Resp 17   Ht 5\' 4"  (1.626 m)   Wt 165 lb (74.8 kg)   SpO2 98%   BMI 28.32 kg/m  Wt Readings from Last 3 Encounters:  04/25/19 165 lb (74.8 kg)  02/26/19 156 lb 9.6 oz (71 kg)  01/02/19 160 lb (72.6 kg)     Lab Results  Component Value Date   WBC 6.6 11/09/2017   HGB 14.3 11/09/2017   HCT 40.5 11/09/2017   PLT 319 11/09/2017   GLUCOSE 96 11/09/2017   CHOL 205 (H) 11/09/2017   TRIG 90 11/09/2017   HDL 53 11/09/2017   LDLDIRECT 208.0 08/27/2015   LDLCALC 133 (H) 11/09/2017   ALT 29 11/09/2017   AST 21 11/09/2017   NA 138 11/09/2017   K 3.7 11/09/2017   CL 99 11/09/2017   CREATININE 0.86 11/09/2017   BUN 17 11/09/2017   CO2 29 11/09/2017   TSH 2.01 11/09/2017    No results found.   Assessment & Plan:  Plan  I am having [name] start on methocarbamol, omeprazole, and losartan. I am also having her maintain her liothyronine, amLODipine, atorvastatin, ciclopirox, ALPRAZolam, and chlorthalidone.  Meds ordered this encounter  Medications  . methocarbamol (ROBAXIN) 500 MG tablet    Sig: Take 1 tablet (500 mg total) by mouth 4 (four) times daily.    Dispense:  30 tablet    Refill:  0  . omeprazole (PRILOSEC) 20 MG capsule    Sig: Take 1 capsule (20 mg total) by mouth daily.    Dispense:  30 capsule    Refill:  3  . losartan (COZAAR) 50 MG tablet    Sig: Take 1 tablet (50 mg total) by mouth daily.    Dispense:  30 tablet    Refill:  2    Problem List Items Addressed This Visit      Unprioritized   Acute right-sided thoracic back pain    ? Ms or GB Check [place]  Muscle relaxer sent in rto if no improvement       Relevant Medications   methocarbamol (ROBAXIN) 500 MG tablet   Essential hypertension    Poorly controlled will alter medications, encouraged DASH diet, minimize caffeine and obtain adequate sleep. Report concerning symptoms and follow up as directed and as  needed con't norvasc and chlorthalidone Add losartan 50 mg daily  Recheck 2-3 weeks       Relevant Medications   losartan (COZAAR) 50 MG tablet   Pain, abdominal, RUQ - Primary    Omeprazole daily Check labs and UD R/o gb -- -? h pylori       Relevant Medications   methocarbamol (ROBAXIN) 500 MG tablet   omeprazole (PRILOSEC) 20 MG capsule   Other Relevant Orders   US Abdomen Limited  RUQ   CBC with Differential/Platelet   Comprehensive metabolic panel   H. pylori antibody, IgG    Other Visit Diagnoses    Encounter for drug screening       Relevant Orders   Pain Mgmt, Profile 8 w/Conf, U      Follow-up: Return if symptoms worsen or fail to improve.  Ann Held, DO

## 2019-04-25 NOTE — Assessment & Plan Note (Signed)
Omeprazole daily Check labs and UD R/o gb -- -? h pylori

## 2019-04-25 NOTE — Assessment & Plan Note (Signed)
?   Ms or GB Check [place]  Muscle relaxer sent in rto if no improvement

## 2019-04-25 NOTE — Patient Instructions (Signed)

## 2019-04-26 LAB — CBC WITH DIFFERENTIAL/PLATELET
Absolute Monocytes: 670 cells/uL (ref 200–950)
Basophils Absolute: 30 cells/uL (ref 0–200)
Basophils Relative: 0.3 %
Eosinophils Absolute: 140 cells/uL (ref 15–500)
Eosinophils Relative: 1.4 %
HCT: 40.9 % (ref 35.0–45.0)
Hemoglobin: 13.9 g/dL (ref 11.7–15.5)
Lymphs Abs: 3270 cells/uL (ref 850–3900)
MCH: 30.8 pg (ref 27.0–33.0)
MCHC: 34 g/dL (ref 32.0–36.0)
MCV: 90.7 fL (ref 80.0–100.0)
MPV: 10.5 fL (ref 7.5–12.5)
Monocytes Relative: 6.7 %
Neutro Abs: 5890 cells/uL (ref 1500–7800)
Neutrophils Relative %: 58.9 %
Platelets: 355 10*3/uL (ref 140–400)
RBC: 4.51 10*6/uL (ref 3.80–5.10)
RDW: 12.8 % (ref 11.0–15.0)
Total Lymphocyte: 32.7 %
WBC: 10 10*3/uL (ref 3.8–10.8)

## 2019-04-26 LAB — COMPREHENSIVE METABOLIC PANEL
AG Ratio: 1.8 (calc) (ref 1.0–2.5)
ALT: 20 U/L (ref 6–29)
AST: 17 U/L (ref 10–35)
Albumin: 4.4 g/dL (ref 3.6–5.1)
Alkaline phosphatase (APISO): 58 U/L (ref 37–153)
BUN: 13 mg/dL (ref 7–25)
CO2: 30 mmol/L (ref 20–32)
Calcium: 10.7 mg/dL — ABNORMAL HIGH (ref 8.6–10.4)
Chloride: 102 mmol/L (ref 98–110)
Creat: 0.83 mg/dL (ref 0.50–1.05)
Globulin: 2.5 g/dL (calc) (ref 1.9–3.7)
Glucose, Bld: 91 mg/dL (ref 65–99)
Potassium: 3.9 mmol/L (ref 3.5–5.3)
Sodium: 140 mmol/L (ref 135–146)
Total Bilirubin: 0.5 mg/dL (ref 0.2–1.2)
Total Protein: 6.9 g/dL (ref 6.1–8.1)

## 2019-04-27 LAB — PAIN MGMT, PROFILE 8 W/CONF, U
6 Acetylmorphine: NEGATIVE ng/mL
Alcohol Metabolites: POSITIVE ng/mL — AB (ref <500)
Amphetamines: NEGATIVE ng/mL
Benzodiazepines: NEGATIVE ng/mL
Buprenorphine, Urine: NEGATIVE ng/mL
Cocaine Metabolite: NEGATIVE ng/mL
Creatinine: 74.4 mg/dL
Ethyl Glucuronide (ETG): 7347 ng/mL
Ethyl Sulfate (ETS): 3050 ng/mL
MDMA: NEGATIVE ng/mL
Marijuana Metabolite: NEGATIVE ng/mL
Opiates: NEGATIVE ng/mL
Oxidant: NEGATIVE ug/mL
Oxycodone: NEGATIVE ng/mL
pH: 7.1 (ref 4.5–9.0)

## 2019-04-28 ENCOUNTER — Telehealth: Payer: Self-pay | Admitting: *Deleted

## 2019-04-28 ENCOUNTER — Ambulatory Visit (HOSPITAL_BASED_OUTPATIENT_CLINIC_OR_DEPARTMENT_OTHER)
Admission: RE | Admit: 2019-04-28 | Discharge: 2019-04-28 | Disposition: A | Discharge status: Home / Self Care | Payer: 59 | Source: Ambulatory Visit | Attending: Family Medicine | Admitting: Family Medicine

## 2019-04-28 ENCOUNTER — Other Ambulatory Visit: Payer: Self-pay

## 2019-04-28 DIAGNOSIS — R1011 Right upper quadrant pain: Secondary | ICD-10-CM

## 2019-04-28 DIAGNOSIS — K828 Other specified diseases of gallbladder: Secondary | ICD-10-CM

## 2019-04-28 LAB — H. PYLORI ANTIBODY, IGG: H. pylori, IgG AbS: 0.5 Index Value (ref 0.00–0.79)

## 2019-04-28 NOTE — Telephone Encounter (Signed)
Spoke with patient and she states that she is still having constant pain.  She has tried the muscle relaxant and no relief.  She did not get omeprazole because of cost and she is not having heart burn.  Her [place] appt is on Wednesday.  I tried to call imaging to see if we can get sooner appt and they did not have anything.  Advised patient that she can call tomorrow to see if they have any cancellations or she can go to another location.  She will keep trying the muscle relaxant with Tylenol and see if that will help.  She will let us know if it does not.

## 2019-04-28 NOTE — Telephone Encounter (Signed)
Copied from [facility]. Topic: General - Inquiry >> Apr 28, 2019  9:14 AM Percell Belt A wrote: Reason for CRM: pt called in stated she was in office last Friday and was told if her symptoms got worse to contact the office to let Dr Etter Sjogren know.  She stated the meds are not helping and they could not do the xray to wed.  Please advise [phone]- best number to reach her

## 2019-04-29 ENCOUNTER — Ambulatory Visit (HOSPITAL_BASED_OUTPATIENT_CLINIC_OR_DEPARTMENT_OTHER): Admission: RE | Admit: 2019-04-29 | Payer: 59 | Source: Ambulatory Visit

## 2019-04-29 NOTE — Telephone Encounter (Signed)
Patient notified

## 2019-04-29 NOTE — Telephone Encounter (Signed)
Patient given results of US-which shows gb sludge and lab results that are normal.  She is willing to go to surgeon but she is having some severe pain in her shoulder blades and would like to see if there is anything she can get for the pain?  She has tried the muscle relaxant and otc pain relievers with no relief.

## 2019-04-29 NOTE — Telephone Encounter (Signed)
I believe it it the GB that is causing the pain in her shoulder blade-- can we get her into surgeon quickly If pain is severe==  Go to er and they can have surgeon see her there --- at [facility]

## 2019-04-30 ENCOUNTER — Ambulatory Visit (HOSPITAL_BASED_OUTPATIENT_CLINIC_OR_DEPARTMENT_OTHER): Payer: 59

## 2019-04-30 ENCOUNTER — Telehealth: Payer: Self-pay | Admitting: *Deleted

## 2019-04-30 NOTE — Telephone Encounter (Signed)
Who was filling it for her?  She needs f/u with them

## 2019-04-30 NOTE — Telephone Encounter (Signed)
Patient usually sees [facility], weight loss clinic.  She has not seen them in 2 months and has not taken meds in 2 weeks.  She will call and follow up with them for refill.  Patient has an appt next Thursday at [place] for gallbladder issue.

## 2019-04-30 NOTE — Telephone Encounter (Signed)
[facility] [address] requesting refill on cytomel 5mg .  I do not see where you have wrote this for her, it has historical provider.  Will you refill for her?

## 2019-05-12 ENCOUNTER — Other Ambulatory Visit: Payer: Self-pay

## 2019-05-13 ENCOUNTER — Ambulatory Visit (INDEPENDENT_AMBULATORY_CARE_PROVIDER_SITE_OTHER): Payer: 59 | Admitting: Family Medicine

## 2019-05-13 ENCOUNTER — Encounter: Payer: Self-pay | Admitting: Family Medicine

## 2019-05-13 ENCOUNTER — Other Ambulatory Visit: Payer: Self-pay

## 2019-05-13 VITALS — BP 140/80 | HR 84 | Temp 97.5°F | Resp 18 | Ht 64.0 in | Wt 161.8 lb

## 2019-05-13 DIAGNOSIS — D229 Melanocytic nevi, unspecified: Secondary | ICD-10-CM

## 2019-05-13 DIAGNOSIS — Z86010 Personal history of colonic polyps: Secondary | ICD-10-CM | POA: Diagnosis not present

## 2019-05-13 DIAGNOSIS — H9312 Tinnitus, left ear: Secondary | ICD-10-CM

## 2019-05-13 DIAGNOSIS — Z0000 Encounter for general adult medical examination without abnormal findings: Secondary | ICD-10-CM

## 2019-05-13 NOTE — Patient Instructions (Signed)

## 2019-05-13 NOTE — Progress Notes (Signed)
Back pain between shoulders- gall bladder sludge Rescheduled appointment but now a 2/10  Ringing in ears- tinnitus No headaches, dizziness, ear pain Some blurry vision- this year saw eye doctor  HTN checked twice since last time No SOB CP edema abdominal pain

## 2019-05-13 NOTE — Progress Notes (Signed)
Subjective:     Jean Blair is a 58 y.o. female and is here for a comprehensive physical exam. The patient reports problems with ringing in ear and dec hearing.  This has been going on for a while she just never said anything.    Social History   Socioeconomic History  . Marital status: Single    Spouse name: Not on file  . Number of children: Not on file  . Years of education: Not on file  . Highest education level: Not on file  Occupational History  . Not on file  Social Needs  . Financial resource strain: Not on file  . Food insecurity    Worry: Not on file    Inability: Not on file  . Transportation needs    Medical: Not on file    Non-medical: Not on file  Tobacco Use  . Smoking status: Former Smoker    Types: Cigarettes    Quit date: 11/18/2000    Years since quitting: 18.4  . Smokeless tobacco: Never Used  Substance and Sexual Activity  . Alcohol use: Yes    Alcohol/week: 7.0 standard drinks    Types: 7 Glasses of wine per week  . Drug use: No  . Sexual activity: Not on file  Lifestyle  . Physical activity    Days per week: Not on file    Minutes per session: Not on file  . Stress: Not on file  Relationships  . Social Herbalist on phone: Not on file    Gets together: Not on file    Attends religious service: Not on file    Active member of club or organization: Not on file    Attends meetings of clubs or organizations: Not on file    Relationship status: Not on file  . Intimate partner violence    Fear of current or ex partner: Not on file    Emotionally abused: Not on file    Physically abused: Not on file    Forced sexual activity: Not on file  Other Topics Concern  . Not on file  Social History Narrative  . Not on file   Health Maintenance  Topic Date Due  . COLONOSCOPY  12/04/2017  . INFLUENZA VACCINE  10/15/2019 (Originally 02/15/2019)  . MAMMOGRAM  06/07/2019  . PAP SMEAR-Modifier  08/17/2020  . DEXA SCAN  03/06/2021  .  TETANUS/TDAP  06/06/2026  . Hepatitis C Screening  Completed  . HIV Screening  Completed    The following portions of the patient's history were reviewed and updated as appropriate:  She  has a past medical history of Anemia, Anxiety, Cataract, and Hyperlipidemia. She does not have any pertinent problems on file. She  has a past surgical history that includes Tubal ligation (1991) and Colonoscopy. Her family history includes Alcohol abuse in an other family member; Anxiety disorder in an other family member; Cancer in her brother; Diabetes in her mother and another family member; Heart attack in her maternal grandfather; Heart disease in her maternal grandfather; Hypertension in an other family member; Mental illness in an other family member; Stroke in an other family member. She  reports that she quit smoking about 18 years ago. Her smoking use included cigarettes. She has never used smokeless tobacco. She reports current alcohol use of about 7.0 standard drinks of alcohol per week. She reports that she does not use drugs. She has a current medication list which includes the following prescription(s): alprazolam,  atorvastatin, chlorthalidone, ciclopirox, liothyronine, losartan, amlodipine, and methocarbamol. Current Outpatient Medications on File Prior to Visit  Medication Sig Dispense Refill  . ALPRAZolam (XANAX) 0.5 MG tablet Take 1 tablet (0.5 mg total) by mouth daily as needed. 30 tablet 2  . atorvastatin (LIPITOR) 40 MG tablet TAKE 1 TABLET (40 MG TOTAL) BY MOUTH DAILY. 90 tablet 2  . chlorthalidone (HYGROTON) 25 MG tablet TAKE 1 TABLET BY MOUTH EVERY DAY 30 tablet 4  . ciclopirox (PENLAC) 8 % solution APPLY OVER NAIL & SKIN AT BEDTIME. APPLY OVER PREVIOUS COAT, AFTER 7 DAYS MAY REMOVE, CONTINUE CYCLE 6.6 mL 0  . liothyronine (CYTOMEL) 5 MCG tablet Take 10 mcg by mouth daily.    Marland Kitchen losartan (COZAAR) 50 MG tablet Take 1 tablet (50 mg total) by mouth daily. 30 tablet 2  . amLODipine (NORVASC)  5 MG tablet Take 1 tablet (5 mg total) by mouth daily. 90 tablet 3  . methocarbamol (ROBAXIN) 500 MG tablet Take 1 tablet (500 mg total) by mouth 4 (four) times daily. (Patient not taking: Reported on 05/13/2019) 30 tablet 0   No current facility-administered medications on file prior to visit.    She is allergic to codeine..  Review of Systems Review of Systems  Constitutional: Negative for activity change, appetite change and fatigue.  HENT: Negative for hearing loss, congestion, tinnitus and ear discharge.  dentist q51m Eyes: Negative for visual disturbance (see optho q1y -- vision corrected to 20/20 with glasses).  Respiratory: Negative for cough, chest tightness and shortness of breath.   Cardiovascular: Negative for chest pain, palpitations and leg swelling.  Gastrointestinal: Negative for abdominal pain, diarrhea, constipation and abdominal distention.  Genitourinary: Negative for urgency, frequency, decreased urine volume and difficulty urinating.  Musculoskeletal: Negative for back pain, arthralgias and gait problem.  Skin: Negative for color change, pallor and rash.  Neurological: Negative for dizziness, light-headedness, numbness and headaches.  Hematological: Negative for adenopathy. Does not bruise/bleed easily.  Psychiatric/Behavioral: Negative for suicidal ideas, confusion, sleep disturbance, self-injury, dysphoric mood, decreased concentration and agitation.       Objective:    BP 140/80 (BP Location: Left Arm, Patient Position: Sitting, Cuff Size: Normal)   Pulse 84   Temp (!) 97.5 F (36.4 C) (Temporal)   Resp 18   Ht 5\' 4"  (1.626 m)   Wt 161 lb 12.8 oz (73.4 kg)   SpO2 99%   BMI 27.77 kg/m  General appearance: alert, cooperative, appears stated age and no distress Head: Normocephalic, without obvious abnormality, atraumatic Eyes: conjunctivae/corneas clear. PERRL, EOM's intact. Fundi benign. Ears: normal TM's and external ear canals both ears Nose: Nares  normal. Septum midline. Mucosa normal. No drainage or sinus tenderness. Throat: lips, mucosa, and tongue normal; teeth and gums normal Neck: no adenopathy, no carotid bruit, no JVD, supple, symmetrical, trachea midline and thyroid not enlarged, symmetric, no tenderness/mass/nodules Back: symmetric, no curvature. ROM normal. No CVA tenderness. Lungs: clear to auscultation bilaterally Breasts: gyn Heart: regular rate and rhythm, S1, S2 normal, no murmur, click, rub or gallop Abdomen: soft, non-tender; bowel sounds normal; no masses,  no organomegaly Pelvic: gyn Extremities: extremities normal, atraumatic, no cyanosis or edema Pulses: 2+ and symmetric Skin: Skin color, texture, turgor normal. No rashes or lesions Lymph nodes: Cervical, supraclavicular, and axillary nodes normal. Neurologic: Alert and oriented X 3, normal strength and tone. Normal symmetric reflexes. Normal coordination and gait    Assessment:    Healthy female exam.      Plan:  ghm utd  Check labs See After Visit Summary for Counseling Recommendations    1. History of colon polyps  - Ambulatory referral to Gastroenterology  2. Suspicious nevus  - Ambulatory referral to Dermatology  3. Tinnitus of left ear Chronic , daughter c/o dec hearing  - Ambulatory referral to Audiology  4. Preventative health care See above - Lipid panel - TSH - CBC with Differential/Platelet - Comprehensive metabolic panel

## 2019-05-14 LAB — CBC WITH DIFFERENTIAL/PLATELET
Basophils Absolute: 0.1 10*3/uL (ref 0.0–0.1)
Basophils Relative: 0.9 % (ref 0.0–3.0)
Eosinophils Absolute: 0.1 10*3/uL (ref 0.0–0.7)
Eosinophils Relative: 0.6 % (ref 0.0–5.0)
HCT: 43.5 % (ref 36.0–46.0)
Hemoglobin: 14.8 g/dL (ref 12.0–15.0)
Lymphocytes Relative: 23.6 % (ref 12.0–46.0)
Lymphs Abs: 3 10*3/uL (ref 0.7–4.0)
MCHC: 34 g/dL (ref 30.0–36.0)
MCV: 91.7 fl (ref 78.0–100.0)
Monocytes Absolute: 0.7 10*3/uL (ref 0.1–1.0)
Monocytes Relative: 5.6 % (ref 3.0–12.0)
Neutro Abs: 8.7 10*3/uL — ABNORMAL HIGH (ref 1.4–7.7)
Neutrophils Relative %: 69.3 % (ref 43.0–77.0)
Platelets: 364 10*3/uL (ref 150.0–400.0)
RBC: 4.75 Mil/uL (ref 3.87–5.11)
RDW: 13.3 % (ref 11.5–15.5)
WBC: 12.6 10*3/uL — ABNORMAL HIGH (ref 4.0–10.5)

## 2019-05-14 LAB — COMPREHENSIVE METABOLIC PANEL
ALT: 17 U/L (ref 0–35)
AST: 18 U/L (ref 0–37)
Albumin: 4.8 g/dL (ref 3.5–5.2)
Alkaline Phosphatase: 56 U/L (ref 39–117)
BUN: 11 mg/dL (ref 6–23)
CO2: 29 mEq/L (ref 19–32)
Calcium: 10.8 mg/dL — ABNORMAL HIGH (ref 8.4–10.5)
Chloride: 100 mEq/L (ref 96–112)
Creatinine, Ser: 0.78 mg/dL (ref 0.40–1.20)
GFR: 92.33 mL/min (ref >60.00)
Glucose, Bld: 100 mg/dL — ABNORMAL HIGH (ref 70–99)
Potassium: 3.8 mEq/L (ref 3.5–5.1)
Sodium: 138 mEq/L (ref 135–145)
Total Bilirubin: 0.4 mg/dL (ref 0.2–1.2)
Total Protein: 7.3 g/dL (ref 6.0–8.3)

## 2019-05-14 LAB — LIPID PANEL
Cholesterol: 182 mg/dL (ref 0–200)
HDL: 46.1 mg/dL (ref >39.00)
LDL Cholesterol: 116 mg/dL — ABNORMAL HIGH (ref 0–99)
NonHDL: 136.19
Total CHOL/HDL Ratio: 4
Triglycerides: 102 mg/dL (ref 0.0–149.0)
VLDL: 20.4 mg/dL (ref 0.0–40.0)

## 2019-05-15 LAB — TSH: TSH: 1.79 u[IU]/mL (ref 0.35–4.50)

## 2019-06-02 ENCOUNTER — Encounter: Payer: Self-pay | Admitting: Gastroenterology

## 2019-06-05 ENCOUNTER — Other Ambulatory Visit: Payer: Self-pay | Admitting: Surgery

## 2019-06-05 ENCOUNTER — Other Ambulatory Visit (HOSPITAL_COMMUNITY): Payer: Self-pay | Admitting: Surgery

## 2019-06-05 DIAGNOSIS — K838 Other specified diseases of biliary tract: Secondary | ICD-10-CM

## 2019-06-09 ENCOUNTER — Encounter: Payer: Self-pay | Admitting: Family Medicine

## 2019-06-18 ENCOUNTER — Other Ambulatory Visit: Payer: Self-pay

## 2019-06-18 ENCOUNTER — Encounter (HOSPITAL_COMMUNITY)
Admission: RE | Admit: 2019-06-18 | Discharge: 2019-06-18 | Disposition: A | Discharge status: Home / Self Care | Payer: 59 | Source: Ambulatory Visit | Attending: Surgery | Admitting: Surgery

## 2019-06-18 DIAGNOSIS — K838 Other specified diseases of biliary tract: Secondary | ICD-10-CM | POA: Diagnosis not present

## 2019-06-18 MED ORDER — TECHNETIUM TC 99M MEBROFENIN IV KIT
5.4300 | PACK | Freq: Once | INTRAVENOUS | Status: AC | PRN
  Administered 2019-06-18: 5.43 via INTRAVENOUS

## 2019-07-01 ENCOUNTER — Other Ambulatory Visit: Payer: Self-pay | Admitting: Cardiovascular Disease

## 2019-07-02 ENCOUNTER — Encounter: Payer: Self-pay | Admitting: Gastroenterology

## 2019-07-02 ENCOUNTER — Ambulatory Visit (AMBULATORY_SURGERY_CENTER): Payer: 59 | Admitting: *Deleted

## 2019-07-02 ENCOUNTER — Other Ambulatory Visit: Payer: Self-pay

## 2019-07-02 VITALS — Temp 96.8°F | Ht 64.0 in | Wt 160.0 lb

## 2019-07-02 DIAGNOSIS — Z1159 Encounter for screening for other viral diseases: Secondary | ICD-10-CM

## 2019-07-02 DIAGNOSIS — Z86010 Personal history of colonic polyps: Secondary | ICD-10-CM

## 2019-07-02 MED ORDER — SUPREP BOWEL PREP KIT 17.5-3.13-1.6 GM/177ML PO SOLN: 1.0000 | Freq: Once | ORAL | 0 refills | Status: AC

## 2019-07-02 NOTE — Progress Notes (Signed)
No egg or soy allergy known to patient  No issues with past sedation with any surgeries  or procedures, no intubation problems  No diet pills per patient No home 02 use per patient  No blood thinners per patient  Pt denies issues with constipation  No A fib or A flutter  EMMI video sent to pt's e mail  suprep $15 coupon   Due to the COVID-19 pandemic we are asking patients to follow these guidelines. Please only bring one care partner. Please be aware that your care partner may wait in the car in the parking lot or if they feel like they will be too hot to wait in the car, they may wait in the lobby on the 4th floor. All care partners are required to wear a mask the entire time (we do not have any that we can provide them), they need to practice social distancing, and we will do a Covid check for all patient's and care partners when you arrive. Also we will check their temperature and your temperature. If the care partner waits in their car they need to stay in the parking lot the entire time and we will call them on their cell phone when the patient is ready for discharge so they can bring the car to the front of the building. Also all patient's will need to wear a mask into building.

## 2019-07-09 LAB — HM MAMMOGRAPHY

## 2019-07-14 ENCOUNTER — Ambulatory Visit (INDEPENDENT_AMBULATORY_CARE_PROVIDER_SITE_OTHER): Payer: 59

## 2019-07-14 ENCOUNTER — Other Ambulatory Visit: Payer: Self-pay | Admitting: Gastroenterology

## 2019-07-14 DIAGNOSIS — Z1159 Encounter for screening for other viral diseases: Secondary | ICD-10-CM

## 2019-07-14 LAB — SARS CORONAVIRUS 2 (TAT 6-24 HRS): SARS Coronavirus 2: NEGATIVE

## 2019-07-16 ENCOUNTER — Encounter: Payer: Self-pay | Admitting: Gastroenterology

## 2019-07-16 ENCOUNTER — Other Ambulatory Visit: Payer: Self-pay

## 2019-07-16 ENCOUNTER — Ambulatory Visit (AMBULATORY_SURGERY_CENTER): Payer: 59 | Admitting: Gastroenterology

## 2019-07-16 VITALS — BP 133/82 | HR 58 | Temp 98.6°F | Resp 16 | Ht 64.0 in | Wt 160.0 lb

## 2019-07-16 DIAGNOSIS — D122 Benign neoplasm of ascending colon: Secondary | ICD-10-CM

## 2019-07-16 DIAGNOSIS — D123 Benign neoplasm of transverse colon: Secondary | ICD-10-CM

## 2019-07-16 DIAGNOSIS — D125 Benign neoplasm of sigmoid colon: Secondary | ICD-10-CM | POA: Diagnosis not present

## 2019-07-16 DIAGNOSIS — D120 Benign neoplasm of cecum: Secondary | ICD-10-CM | POA: Diagnosis not present

## 2019-07-16 DIAGNOSIS — D128 Benign neoplasm of rectum: Secondary | ICD-10-CM

## 2019-07-16 DIAGNOSIS — D129 Benign neoplasm of anus and anal canal: Secondary | ICD-10-CM

## 2019-07-16 DIAGNOSIS — Z86010 Personal history of colonic polyps: Secondary | ICD-10-CM | POA: Diagnosis present

## 2019-07-16 DIAGNOSIS — D127 Benign neoplasm of rectosigmoid junction: Secondary | ICD-10-CM | POA: Diagnosis not present

## 2019-07-16 DIAGNOSIS — K621 Rectal polyp: Secondary | ICD-10-CM | POA: Diagnosis not present

## 2019-07-16 MED ORDER — SODIUM CHLORIDE 0.9 % IV SOLN: 500.0000 mL | Freq: Once | INTRAVENOUS | Status: DC

## 2019-07-16 NOTE — Op Note (Signed)
[place] Patient Name: Jean Blair Procedure Date: 07/16/2019 8:28 AM MRN: [id number] Endoscopist: Remo Lipps P. Havery Moros , MD Age: 58 Referring MD:  Date of Birth: 17-Apr-1961 Gender: Female Account #: [id number] Procedure:                Colonoscopy Indications:              Surveillance: Personal history of adenomatous                            polyps on last colonoscopy 2014 (advanced adenoma) Medicines:                Monitored Anesthesia Care Procedure:                Pre-Anesthesia Assessment:                           - Prior to the procedure, a History and Physical                            was performed, and patient medications and                            allergies were reviewed. The patient's tolerance of                            previous anesthesia was also reviewed. The risks                            and benefits of the procedure and the sedation                            options and risks were discussed with the patient.                            All questions were answered, and informed consent                            was obtained. Prior Anticoagulants: The patient has                            taken no previous anticoagulant or antiplatelet                            agents. ASA Grade Assessment: II - A patient with                            mild systemic disease. After reviewing the risks                            and benefits, the patient was deemed in                            satisfactory condition to undergo the procedure.  After obtaining informed consent, the colonoscope                            was passed under direct vision. Throughout the                            procedure, the patient's blood pressure, pulse, and                            oxygen saturations were monitored continuously. The                            Colonoscope was introduced through the anus and   advanced to the the cecum, identified by                            appendiceal orifice and ileocecal valve. The                            colonoscopy was performed without difficulty. The                            patient tolerated the procedure well. The quality                            of the bowel preparation was good. The ileocecal                            valve, appendiceal orifice, and rectum were                            photographed. Scope In: 8:30:39 AM Scope Out: 8:56:02 AM Scope Withdrawal Time: 0 hours 13 minutes 50 seconds  Total Procedure Duration: 0 hours 25 minutes 23 seconds  Findings:                 The perianal and digital rectal examinations were                            normal.                           A 3 mm polyp was found in the cecum. The polyp was                            sessile. The polyp was removed with a cold snare.                            Resection and retrieval were complete.                           A 8 mm polyp was found in the ascending colon. The                            polyp was pedunculated. The  polyp was removed with                            a hot snare. Resection and retrieval were complete.                           Three sessile polyps were found in the transverse                            colon. The polyps were 3 to 5 mm in size. These                            polyps were removed with a cold snare. Resection                            and retrieval were complete.                           A 5 mm polyp was found in the sigmoid colon. The                            polyp was sessile. The polyp was removed with a                            cold snare. Resection and retrieval were complete.                           Three sessile polyps were found in the                            recto-sigmoid colon. The polyps were 4 to 6 mm in                            size. These polyps were removed with a cold snare.                             Resection and retrieval were complete.                           Four sessile polyps were found in the rectum. The                            polyps were 3 to 4 mm in size. These polyps were                            removed with a cold snare. Resection and retrieval                            were complete.                           A few small-mouthed diverticula were found in the  distal transverse colon and left colon.                           Internal hemorrhoids were found during retroflexion.                           The exam was otherwise without abnormality. Complications:            No immediate complications. Estimated blood loss:                            Minimal. Estimated Blood Loss:     Estimated blood loss was minimal. Impression:               - One 3 mm polyp in the cecum, removed with a cold                            snare. Resected and retrieved.                           - One 8 mm polyp in the ascending colon, removed                            with a hot snare. Resected and retrieved.                           - Three 3 to 5 mm polyps in the transverse colon,                            removed with a cold snare. Resected and retrieved.                           - One 5 mm polyp in the sigmoid colon, removed with                            a cold snare. Resected and retrieved.                           - Three 4 to 6 mm polyps at the recto-sigmoid                            colon, removed with a cold snare. Resected and                            retrieved.                           - Four 3 to 4 mm polyps in the rectum, removed with                            a cold snare. Resected and retrieved.                           - Diverticulosis in the distal transverse  colon and                            in the left colon.                           - Internal hemorrhoids.                           - The examination was otherwise  normal. Recommendation:           - Patient has a contact number available for                            emergencies. The signs and symptoms of potential                            delayed complications were discussed with the                            patient. Return to normal activities tomorrow.                            Written discharge instructions were provided to the                            patient.                           - Resume previous diet.                           - Continue present medications.                           - Await pathology results. Remo Lipps P. Lindsey Demonte, MD 07/16/2019 9:03:53 AM This report has been signed electronically.

## 2019-07-16 NOTE — Patient Instructions (Signed)
Handouts given on Diverticulosis, Hemorrhoids, and polyp's.  YOU HAD AN ENDOSCOPIC PROCEDURE TODAY AT [facility]:   Refer to the procedure report that was given to you for any specific questions about what was found during the examination.  If the procedure report does not answer your questions, please call your gastroenterologist to clarify.  If you requested that your care partner not be given the details of your procedure findings, then the procedure report has been included in a sealed envelope for you to review at your convenience later.  YOU SHOULD EXPECT: Some feelings of bloating in the abdomen. Passage of more gas than usual.  Walking can help get rid of the air that was put into your GI tract during the procedure and reduce the bloating. If you had a lower endoscopy (such as a colonoscopy or flexible sigmoidoscopy) you may notice spotting of blood in your stool or on the toilet paper. If you underwent a bowel prep for your procedure, you may not have a normal bowel movement for a few days.  Please Note:  You might notice some irritation and congestion in your nose or some drainage.  This is from the oxygen used during your procedure.  There is no need for concern and it should clear up in a day or so.  SYMPTOMS TO REPORT IMMEDIATELY:   Following lower endoscopy (colonoscopy or flexible sigmoidoscopy):  Excessive amounts of blood in the stool  Significant tenderness or worsening of abdominal pains  Swelling of the abdomen that is new, acute  Fever of 100F or higher   For urgent or emergent issues, a gastroenterologist can be reached at any hour by calling [phone].   DIET:  We do recommend a small meal at first, but then you may proceed to your regular diet.  Drink plenty of fluids but you should avoid alcoholic beverages for 24 hours.  ACTIVITY:  You should plan to take it easy for the rest of today and you should NOT DRIVE or use heavy machinery until  tomorrow (because of the sedation medicines used during the test).    FOLLOW UP: Our staff will call the number listed on your records 48-72 hours following your procedure to check on you and address any questions or concerns that you may have regarding the information given to you following your procedure. If we do not reach you, we will leave a message.  We will attempt to reach you two times.  During this call, we will ask if you have developed any symptoms of COVID 19. If you develop any symptoms (ie: fever, flu-like symptoms, shortness of breath, cough etc.) before then, please call [phone].  If you test positive for Covid 19 in the 2 weeks post procedure, please call and report this information to [place].    If any biopsies were taken you will be contacted by phone or by letter within the next 1-3 weeks.  Please call us at [phone] if you have not heard about the biopsies in 3 weeks.    SIGNATURES/CONFIDENTIALITY: You and/or your care partner have signed paperwork which will be entered into your electronic medical record.  These signatures attest to the fact that that the information above on your After Visit Summary has been reviewed and is understood.  Full responsibility of the confidentiality of this discharge information lies with you and/or your care-partner.

## 2019-07-16 NOTE — Progress Notes (Signed)
Called to room to assist during endoscopic procedure.  Patient ID and intended procedure confirmed with present staff. Received instructions for my participation in the procedure from the performing physician.  

## 2019-07-16 NOTE — Progress Notes (Signed)
Report to PACU, RN, vss, BBS= Clear.  

## 2019-07-16 NOTE — Progress Notes (Signed)
Temp by JB, Vitals by CW   Pt's states no medical or surgical changes since previsit or office visit.  

## 2019-07-20 ENCOUNTER — Other Ambulatory Visit: Payer: Self-pay | Admitting: Family Medicine

## 2019-07-20 DIAGNOSIS — I10 Essential (primary) hypertension: Secondary | ICD-10-CM

## 2019-07-21 ENCOUNTER — Telehealth: Payer: Self-pay | Admitting: *Deleted

## 2019-07-21 NOTE — Telephone Encounter (Signed)
1. Have you developed a fever since your procedure? no  2.   Have you had an respiratory symptoms (SOB or cough) since your procedure? no  3.   Have you tested positive for COVID 19 since your procedure no  4.   Have you had any family members/close contacts diagnosed with the COVID 19 since your procedure?  no   If yes to any of these questions please route to Joylene John, RN and Alphonsa Gin, Therapist, sports.  Follow up Call-  Call back number 07/16/2019  Post procedure Call Back phone  # [phone]  Permission to leave phone message Yes  Some recent data might be hidden     Patient questions:  Do you have a fever, pain , or abdominal swelling? No. Pain Score  0 *  Have you tolerated food without any problems? Yes.    Have you been able to return to your normal activities? Yes.    Do you have any questions about your discharge instructions: Diet   No. Medications  No. Follow up visit  No.  Do you have questions or concerns about your Care? No.  Actions: * If pain score is 4 or above: No action needed, pain <4.

## 2019-08-18 ENCOUNTER — Telehealth: Payer: Self-pay | Admitting: Family Medicine

## 2019-08-18 NOTE — Telephone Encounter (Signed)
It is not a live vaccine so she can get it ---- part D medicare pays for it at the pharmacy

## 2019-08-18 NOTE — Telephone Encounter (Signed)
Pt fiance has shingles currently (on his face/eye). She declined the vaccine back in October. She is asking if she can get it now or if she has to wait since he has it currently.  Call back [phone]

## 2019-08-19 NOTE — Telephone Encounter (Signed)
Patient notified and nurse visit appointment made for first shingles vaccine.

## 2019-08-21 ENCOUNTER — Ambulatory Visit: Payer: 59

## 2019-08-27 ENCOUNTER — Other Ambulatory Visit: Payer: Self-pay

## 2019-08-27 ENCOUNTER — Ambulatory Visit (INDEPENDENT_AMBULATORY_CARE_PROVIDER_SITE_OTHER): Payer: 59

## 2019-08-27 ENCOUNTER — Ambulatory Visit: Payer: 59 | Attending: Family Medicine | Admitting: Audiology

## 2019-08-27 DIAGNOSIS — Z23 Encounter for immunization: Secondary | ICD-10-CM | POA: Diagnosis not present

## 2019-08-27 DIAGNOSIS — H9313 Tinnitus, bilateral: Secondary | ICD-10-CM | POA: Diagnosis present

## 2019-08-27 NOTE — Procedures (Signed)
  Outpatient Audiology and [facility], [place]  [address] [phone]  AUDIOLOGICAL  EVALUATION  NAME: Jean Blair     DOB:   [birth year]     MRN: [id number]                                                                                     DATE: 08/27/2019  STATUS: Outpatient REFERENT: Ann Held, DO  DIAGNOSIS: Tinnitus   History: [name] was seen for an audiological evaluation today due to tinnitus occurring for approximately 1 year. She reports the tinnitus is intermittent and the severity is worse in the left ear. [name] notes the tinnitus is most noticeable at night or in the morning and she utilizes a fan at night to help with the tinnitus. She denies otalgia, dizziness, aural fullness, or hearing concerns.   Evaluation:   Otoscopy showed a clear view of the tympanic membranes, bilaterally  Tympanometry results were consistent with normal middle ear function, bilaterally.   Ipsilateral Acoustic Reflex Threshold (ARTs) were present and within the normal range at [phone], bilaterally.   Audiometric testing was completed using Conventional Audiometry techniques with insert earphones and TDH headphones. Results are consistent with normal hearing sensitivity [phone], bilaterally. A Speech Recognition Threshold (SRT) was obtained at 15 dB HL, bilaterally. Word Recognition testing was completed at 70 dB HL with 98% in the right ear and 100% in the left ear.   Results:  Today's testing is consistent with normal hearing sensitivity at [phone], bilaterally. [name] is not expected to have communication difficulty. The test results and recommendations were reviewed with [facility]. Tinnitus triggers were reviewed.   Recommendations: 1.  Continue use of fan/noise generator at night.  2.  Monitor tinnitus triggers such as Caffeine, Alcohol, Nicotine, Sodium, Stress, Sleep Difficulty.  3.  Referral to an Ear, Nose, and Throat  Physician if Tinnitus worsens.     Bari Mantis Audiologist, Au.D., CCC-A

## 2019-08-27 NOTE — Patient Instructions (Addendum)

## 2019-10-28 ENCOUNTER — Ambulatory Visit (INDEPENDENT_AMBULATORY_CARE_PROVIDER_SITE_OTHER): Payer: 59

## 2019-10-28 ENCOUNTER — Other Ambulatory Visit: Payer: Self-pay

## 2019-10-28 DIAGNOSIS — Z23 Encounter for immunization: Secondary | ICD-10-CM | POA: Diagnosis not present

## 2019-10-28 NOTE — Progress Notes (Signed)
Patient here today for shingrix vaccine. 0.51mL given in right deltoid IM. Patient tolerated well. VIS given.

## 2019-11-11 ENCOUNTER — Other Ambulatory Visit: Payer: Self-pay | Admitting: Cardiovascular Disease

## 2019-11-11 ENCOUNTER — Encounter: Payer: Self-pay | Admitting: Family Medicine

## 2019-11-11 ENCOUNTER — Ambulatory Visit (INDEPENDENT_AMBULATORY_CARE_PROVIDER_SITE_OTHER): Payer: 59 | Admitting: Family Medicine

## 2019-11-11 ENCOUNTER — Other Ambulatory Visit: Payer: Self-pay

## 2019-11-11 VITALS — BP 122/88 | HR 56 | Temp 97.7°F | Resp 18 | Ht 64.0 in | Wt 159.6 lb

## 2019-11-11 DIAGNOSIS — I10 Essential (primary) hypertension: Secondary | ICD-10-CM | POA: Diagnosis not present

## 2019-11-11 DIAGNOSIS — D72829 Elevated white blood cell count, unspecified: Secondary | ICD-10-CM | POA: Diagnosis not present

## 2019-11-11 DIAGNOSIS — B351 Tinea unguium: Secondary | ICD-10-CM | POA: Diagnosis not present

## 2019-11-11 DIAGNOSIS — E785 Hyperlipidemia, unspecified: Secondary | ICD-10-CM

## 2019-11-11 MED ORDER — CICLOPIROX 8 % EX SOLN: CUTANEOUS | 0 refills | Status: DC

## 2019-11-11 NOTE — Progress Notes (Signed)
Patient ID: Jean Blair, female    DOB: Dec 04, 1960  Age: 59 y.o. MRN: [id number]    Subjective:  Subjective  HPI Jean Blair presents for f/u bp and chol.  She is c/o of thick dark toenails and would like something for fungus   Review of Systems  Constitutional: Negative for appetite change, diaphoresis, fatigue and unexpected weight change.  Eyes: Negative for pain, redness and visual disturbance.  Respiratory: Negative for cough, chest tightness, shortness of breath and wheezing.   Cardiovascular: Negative for chest pain, palpitations and leg swelling.  Endocrine: Negative for cold intolerance, heat intolerance, polydipsia, polyphagia and polyuria.  Genitourinary: Negative for difficulty urinating, dysuria and frequency.  Neurological: Negative for dizziness, light-headedness, numbness and headaches.    History Past Medical History:  Diagnosis Date  . Adenomatous polyps   . Anemia   . Anxiety   . Cataract    forming   . Hyperlipidemia   . Hypertension     She has a past surgical history that includes Tubal ligation (1991); Colonoscopy; Polypectomy; and Upper gastrointestinal endoscopy.   Her family history includes Alcohol abuse in an other family member; Anxiety disorder in an other family member; Bladder Cancer in her brother; Cancer in her brother; Diabetes in her mother and another family member; Heart attack in her maternal grandfather; Heart disease in her maternal grandfather; Hypertension in an other family member; Mental illness in an other family member; Stroke in an other family member.She reports that she quit smoking about 18 years ago. Her smoking use included cigarettes. She has never used smokeless tobacco. She reports current alcohol use of about 7.0 standard drinks of alcohol per week. She reports that she does not use drugs.  Current Outpatient Medications on File Prior to Visit  Medication Sig Dispense Refill  . ALPRAZolam (XANAX) 0.5 MG tablet  Take 1 tablet (0.5 mg total) by mouth daily as needed. 30 tablet 2  . amLODipine (NORVASC) 5 MG tablet Take 1 tablet (5 mg total) by mouth daily. 90 tablet 3  . chlorthalidone (HYGROTON) 25 MG tablet TAKE 1 TABLET BY MOUTH EVERY DAY 90 tablet 1  . losartan (COZAAR) 50 MG tablet TAKE 1 TABLET BY MOUTH EVERY DAY 90 tablet 1  . methocarbamol (ROBAXIN) 500 MG tablet Take 1 tablet (500 mg total) by mouth 4 (four) times daily. (Patient not taking: Reported on 11/11/2019) 30 tablet 0   No current facility-administered medications on file prior to visit.     Objective:  Objective  Physical Exam Vitals and nursing note reviewed.  Constitutional:      Appearance: She is well-developed.  HENT:     Head: Normocephalic and atraumatic.  Eyes:     Conjunctiva/sclera: Conjunctivae normal.  Neck:     Thyroid: No thyromegaly.     Vascular: No carotid bruit or JVD.  Cardiovascular:     Rate and Rhythm: Normal rate and regular rhythm.     Heart sounds: Normal heart sounds. No murmur.  Pulmonary:     Effort: Pulmonary effort is normal. No respiratory distress.     Breath sounds: Normal breath sounds. No wheezing or rales.  Chest:     Chest wall: No tenderness.  Musculoskeletal:     Cervical back: Normal range of motion and neck supple.  Skin:      Neurological:     Mental Status: She is alert and oriented to person, place, and time.    BP 122/88 (BP Location: Right Arm, Patient Position:  Sitting, Cuff Size: Normal)   Pulse (!) 56   Temp 97.7 F (36.5 C) (Temporal)   Resp 18   Ht 5\' 4"  (1.626 m)   Wt 159 lb 9.6 oz (72.4 kg)   LMP 05/17/2015   SpO2 97%   BMI 27.40 kg/m  Wt Readings from Last 3 Encounters:  11/11/19 159 lb 9.6 oz (72.4 kg)  07/16/19 160 lb (72.6 kg)  07/02/19 160 lb (72.6 kg)     Lab Results  Component Value Date   WBC 12.6 (H) 05/13/2019   HGB 14.8 05/13/2019   HCT 43.5 05/13/2019   PLT 364.0 05/13/2019   GLUCOSE 100 (H) 05/13/2019   CHOL 182 05/13/2019    TRIG 102.0 05/13/2019   HDL 46.10 05/13/2019   LDLDIRECT 208.0 08/27/2015   LDLCALC 116 (H) 05/13/2019   ALT 17 05/13/2019   AST 18 05/13/2019   NA 138 05/13/2019   K 3.8 05/13/2019   CL 100 05/13/2019   CREATININE 0.78 05/13/2019   BUN 11 05/13/2019   CO2 29 05/13/2019   TSH 1.79 05/13/2019    NM Hepato W/EjeCT Fract  Result Date: 06/18/2019 CLINICAL DATA:  Abdominal cramping and upper back pain for 2.5 months, biliary sludge EXAM: NUCLEAR MEDICINE HEPATOBILIARY IMAGING WITH GALLBLADDER EF TECHNIQUE: Sequential images of the abdomen were obtained out to 60 minutes following intravenous administration of radiopharmaceutical. After oral ingestion of Ensure, gallbladder ejection fraction was determined. At 60 min, normal ejection fraction is greater than 33%. RADIOPHARMACEUTICALS:  5.43 mCi Tc-104m  Choletec IV COMPARISON:  None FINDINGS: Normal tracer extraction from bloodstream indicating normal hepatocellular function. Normal excretion of tracer into biliary tree. Gallbladder visualized at 13 min. Small bowel visualized at 35 min. No hepatic retention of tracer. Subjectively normal emptying of tracer from gallbladder following fatty meal stimulation. Calculated gallbladder ejection fraction is 75%, normal. Patient reported no symptoms following Ensure ingestion. Normal gallbladder ejection fraction following Ensure ingestion is greater than 33% at 1 hour. IMPRESSION: Normal exam. Electronically Signed   By: Lavonia Dana M.D.   On: 06/18/2019 15:48     Assessment & Plan:  Plan  I have discontinued Jean Blair's liothyronine. I am also having her maintain her amLODipine, ALPRAZolam, methocarbamol, chlorthalidone, losartan, atorvastatin, and ciclopirox.  Meds ordered this encounter  Medications  . ciclopirox (PENLAC) 8 % solution    Sig: APPLY OVER NAIL & SKIN AT BEDTIME. APPLY OVER PREVIOUS COAT, AFTER 7 DAYS MAY REMOVE, CONTINUE CYCLE    Dispense:  6.6 mL    Refill:  0    Problem  List Items Addressed This Visit      Unprioritized   Essential hypertension    Well controlled, no changes to meds. Encouraged heart healthy diet such as the DASH diet and exercise as tolerated.       Relevant Orders   Lipid panel   Comprehensive metabolic panel   Hyperlipidemia - Primary    Encouraged heart healthy diet, increase exercise, avoid trans fats, consider a krill oil cap daily      Relevant Orders   Lipid panel   Comprehensive metabolic panel   Leukocytosis    Recheck today      Relevant Orders   CBC with Differential/Platelet   Onychomycosis    penlac Consider podiatry if no improvement       Relevant Medications   ciclopirox (PENLAC) 8 % solution    Other Visit Diagnoses    Toenail fungus       Relevant  Medications   ciclopirox (PENLAC) 8 % solution      Follow-up: Return in about 6 months (around 05/12/2020).  Ann Held, DO

## 2019-11-11 NOTE — Patient Instructions (Signed)
DASH Eating Plan DASH stands for "Dietary Approaches to Stop Hypertension." The DASH eating plan is a healthy eating plan that has been shown to reduce high blood pressure (hypertension). It may also reduce your risk for type 2 diabetes, heart disease, and stroke. The DASH eating plan may also help with weight loss. What are tips for following this plan?  General guidelines  Avoid eating more than 2,300 mg (milligrams) of salt (sodium) a day. If you have hypertension, you may need to reduce your sodium intake to 1,500 mg a day.  Limit alcohol intake to no more than 1 drink a day for nonpregnant women and 2 drinks a day for men. One drink equals 12 oz of beer, 5 oz of wine, or 1 oz of hard liquor.  Work with your health care provider to maintain a healthy body weight or to lose weight. Ask what an ideal weight is for you.  Get at least 30 minutes of exercise that causes your heart to beat faster (aerobic exercise) most days of the week. Activities may include walking, swimming, or biking.  Work with your health care provider or diet and nutrition specialist (dietitian) to adjust your eating plan to your individual calorie needs. Reading food labels   Check food labels for the amount of sodium per serving. Choose foods with less than 5 percent of the Daily Value of sodium. Generally, foods with less than 300 mg of sodium per serving fit into this eating plan.  To find whole grains, look for the word "whole" as the first word in the ingredient list. Shopping  Buy products labeled as "low-sodium" or "no salt added."  Buy fresh foods. Avoid canned foods and premade or frozen meals. Cooking  Avoid adding salt when cooking. Use salt-free seasonings or herbs instead of table salt or sea salt. Check with your health care provider or pharmacist before using salt substitutes.  Do not fry foods. Cook foods using healthy methods such as baking, boiling, grilling, and broiling instead.  Cook with  heart-healthy oils, such as olive, canola, soybean, or sunflower oil. Meal planning  Eat a balanced diet that includes: ? 5 or more servings of fruits and vegetables each day. At each meal, try to fill half of your plate with fruits and vegetables. ? Up to 6-8 servings of whole grains each day. ? Less than 6 oz of lean meat, poultry, or fish each day. A 3-oz serving of meat is about the same size as a deck of cards. One egg equals 1 oz. ? 2 servings of low-fat dairy each day. ? A serving of nuts, seeds, or beans 5 times each week. ? Heart-healthy fats. Healthy fats called Omega-3 fatty acids are found in foods such as flaxseeds and coldwater fish, like sardines, salmon, and mackerel.  Limit how much you eat of the following: ? Canned or prepackaged foods. ? Food that is high in trans fat, such as fried foods. ? Food that is high in saturated fat, such as fatty meat. ? Sweets, desserts, sugary drinks, and other foods with added sugar. ? Full-fat dairy products.  Do not salt foods before eating.  Try to eat at least 2 vegetarian meals each week.  Eat more home-cooked food and less restaurant, buffet, and fast food.  When eating at a restaurant, ask that your food be prepared with less salt or no salt, if possible. What foods are recommended? The items listed may not be a complete list. Talk with your dietitian about   what dietary choices are best for you. Grains Whole-grain or whole-wheat bread. Whole-grain or whole-wheat pasta. Brown rice. Oatmeal. Quinoa. Bulgur. Whole-grain and low-sodium cereals. Pita bread. Low-fat, low-sodium crackers. Whole-wheat flour tortillas. Vegetables Fresh or frozen vegetables (raw, steamed, roasted, or grilled). Low-sodium or reduced-sodium tomato and vegetable juice. Low-sodium or reduced-sodium tomato sauce and tomato paste. Low-sodium or reduced-sodium canned vegetables. Fruits All fresh, dried, or frozen fruit. Canned fruit in natural juice (without  added sugar). Meat and other protein foods Skinless chicken or turkey. Ground chicken or turkey. Pork with fat trimmed off. Fish and seafood. Egg whites. Dried beans, peas, or lentils. Unsalted nuts, nut butters, and seeds. Unsalted canned beans. Lean cuts of beef with fat trimmed off. Low-sodium, lean deli meat. Dairy Low-fat (1%) or fat-free (skim) milk. Fat-free, low-fat, or reduced-fat cheeses. Nonfat, low-sodium ricotta or cottage cheese. Low-fat or nonfat yogurt. Low-fat, low-sodium cheese. Fats and oils Soft margarine without trans fats. Vegetable oil. Low-fat, reduced-fat, or light mayonnaise and salad dressings (reduced-sodium). Canola, safflower, olive, soybean, and sunflower oils. Avocado. Seasoning and other foods Herbs. Spices. Seasoning mixes without salt. Unsalted popcorn and pretzels. Fat-free sweets. What foods are not recommended? The items listed may not be a complete list. Talk with your dietitian about what dietary choices are best for you. Grains Baked goods made with fat, such as croissants, muffins, or some breads. Dry pasta or rice meal packs. Vegetables Creamed or fried vegetables. Vegetables in a cheese sauce. Regular canned vegetables (not low-sodium or reduced-sodium). Regular canned tomato sauce and paste (not low-sodium or reduced-sodium). Regular tomato and vegetable juice (not low-sodium or reduced-sodium). Pickles. Olives. Fruits Canned fruit in a light or heavy syrup. Fried fruit. Fruit in cream or butter sauce. Meat and other protein foods Fatty cuts of meat. Ribs. Fried meat. Bacon. Sausage. Bologna and other processed lunch meats. Salami. Fatback. Hotdogs. Bratwurst. Salted nuts and seeds. Canned beans with added salt. Canned or smoked fish. Whole eggs or egg yolks. Chicken or turkey with skin. Dairy Whole or 2% milk, cream, and half-and-half. Whole or full-fat cream cheese. Whole-fat or sweetened yogurt. Full-fat cheese. Nondairy creamers. Whipped toppings.  Processed cheese and cheese spreads. Fats and oils Butter. Stick margarine. Lard. Shortening. Ghee. Bacon fat. Tropical oils, such as coconut, palm kernel, or palm oil. Seasoning and other foods Salted popcorn and pretzels. Onion salt, garlic salt, seasoned salt, table salt, and sea salt. Worcestershire sauce. Tartar sauce. Barbecue sauce. Teriyaki sauce. Soy sauce, including reduced-sodium. Steak sauce. Canned and packaged gravies. Fish sauce. Oyster sauce. Cocktail sauce. Horseradish that you find on the shelf. Ketchup. Mustard. Meat flavorings and tenderizers. Bouillon cubes. Hot sauce and Tabasco sauce. Premade or packaged marinades. Premade or packaged taco seasonings. Relishes. Regular salad dressings. Where to find more information:  National Heart, Lung, and Blood Institute: www.nhlbi.nih.gov  American Heart Association: www.heart.org Summary  The DASH eating plan is a healthy eating plan that has been shown to reduce high blood pressure (hypertension). It may also reduce your risk for type 2 diabetes, heart disease, and stroke.  With the DASH eating plan, you should limit salt (sodium) intake to 2,300 mg a day. If you have hypertension, you may need to reduce your sodium intake to 1,500 mg a day.  When on the DASH eating plan, aim to eat more fresh fruits and vegetables, whole grains, lean proteins, low-fat dairy, and heart-healthy fats.  Work with your health care provider or diet and nutrition specialist (dietitian) to adjust your eating plan to your   individual calorie needs. This information is not intended to replace advice given to you by your health care provider. Make sure you discuss any questions you have with your health care provider. Document Revised: 06/15/2017 Document Reviewed: 06/26/2016 Elsevier Patient Education  2020 Elsevier Inc.  

## 2019-11-11 NOTE — Telephone Encounter (Signed)
Rx(s) sent to pharmacy electronically.  

## 2019-11-12 DIAGNOSIS — D72829 Elevated white blood cell count, unspecified: Secondary | ICD-10-CM | POA: Insufficient documentation

## 2019-11-12 DIAGNOSIS — E785 Hyperlipidemia, unspecified: Secondary | ICD-10-CM | POA: Insufficient documentation

## 2019-11-12 NOTE — Assessment & Plan Note (Signed)
Recheck today. 

## 2019-11-12 NOTE — Assessment & Plan Note (Signed)
Encouraged heart healthy diet, increase exercise, avoid trans fats, consider a krill oil cap daily 

## 2019-11-12 NOTE — Assessment & Plan Note (Signed)
[place] Consider podiatry if no improvement

## 2019-11-12 NOTE — Assessment & Plan Note (Signed)
Well controlled, no changes to meds. Encouraged heart healthy diet such as the DASH diet and exercise as tolerated.  °

## 2019-11-26 ENCOUNTER — Other Ambulatory Visit (INDEPENDENT_AMBULATORY_CARE_PROVIDER_SITE_OTHER): Payer: 59

## 2019-11-26 ENCOUNTER — Other Ambulatory Visit: Payer: Self-pay

## 2019-11-26 DIAGNOSIS — E785 Hyperlipidemia, unspecified: Secondary | ICD-10-CM | POA: Diagnosis not present

## 2019-11-26 DIAGNOSIS — I10 Essential (primary) hypertension: Secondary | ICD-10-CM

## 2019-11-26 DIAGNOSIS — D72829 Elevated white blood cell count, unspecified: Secondary | ICD-10-CM | POA: Diagnosis not present

## 2019-11-26 LAB — COMPREHENSIVE METABOLIC PANEL
ALT: 24 U/L (ref 0–35)
AST: 18 U/L (ref 0–37)
Albumin: 4.2 g/dL (ref 3.5–5.2)
Alkaline Phosphatase: 57 U/L (ref 39–117)
BUN: 16 mg/dL (ref 6–23)
CO2: 30 mEq/L (ref 19–32)
Calcium: 10.5 mg/dL (ref 8.4–10.5)
Chloride: 101 mEq/L (ref 96–112)
Creatinine, Ser: 0.77 mg/dL (ref 0.40–1.20)
GFR: 93.54 mL/min (ref >60.00)
Glucose, Bld: 106 mg/dL — ABNORMAL HIGH (ref 70–99)
Potassium: 3.6 mEq/L (ref 3.5–5.1)
Sodium: 140 mEq/L (ref 135–145)
Total Bilirubin: 0.3 mg/dL (ref 0.2–1.2)
Total Protein: 6.6 g/dL (ref 6.0–8.3)

## 2019-11-26 LAB — CBC WITH DIFFERENTIAL/PLATELET
Basophils Absolute: 0 10*3/uL (ref 0.0–0.1)
Basophils Relative: 0.4 % (ref 0.0–3.0)
Eosinophils Absolute: 0.2 10*3/uL (ref 0.0–0.7)
Eosinophils Relative: 2.5 % (ref 0.0–5.0)
HCT: 41.7 % (ref 36.0–46.0)
Hemoglobin: 14.5 g/dL (ref 12.0–15.0)
Lymphocytes Relative: 34.2 % (ref 12.0–46.0)
Lymphs Abs: 3 10*3/uL (ref 0.7–4.0)
MCHC: 34.8 g/dL (ref 30.0–36.0)
MCV: 90.1 fl (ref 78.0–100.0)
Monocytes Absolute: 0.6 10*3/uL (ref 0.1–1.0)
Monocytes Relative: 7 % (ref 3.0–12.0)
Neutro Abs: 4.9 10*3/uL (ref 1.4–7.7)
Neutrophils Relative %: 55.9 % (ref 43.0–77.0)
Platelets: 325 10*3/uL (ref 150.0–400.0)
RBC: 4.63 Mil/uL (ref 3.87–5.11)
RDW: 13.1 % (ref 11.5–15.5)
WBC: 8.8 10*3/uL (ref 4.0–10.5)

## 2019-11-26 LAB — LIPID PANEL
Cholesterol: 179 mg/dL (ref 0–200)
HDL: 43.6 mg/dL (ref >39.00)
LDL Cholesterol: 112 mg/dL — ABNORMAL HIGH (ref 0–99)
NonHDL: 135.06
Total CHOL/HDL Ratio: 4
Triglycerides: 113 mg/dL (ref 0.0–149.0)
VLDL: 22.6 mg/dL (ref 0.0–40.0)

## 2020-01-03 ENCOUNTER — Other Ambulatory Visit: Payer: Self-pay | Admitting: Cardiovascular Disease

## 2020-01-06 ENCOUNTER — Other Ambulatory Visit: Payer: Self-pay | Admitting: Cardiovascular Disease

## 2020-01-21 ENCOUNTER — Other Ambulatory Visit: Payer: Self-pay | Admitting: Family Medicine

## 2020-01-21 DIAGNOSIS — I10 Essential (primary) hypertension: Secondary | ICD-10-CM

## 2020-02-12 ENCOUNTER — Other Ambulatory Visit: Payer: Self-pay | Admitting: Cardiovascular Disease

## 2020-03-08 ENCOUNTER — Other Ambulatory Visit: Payer: Self-pay | Admitting: Cardiovascular Disease

## 2020-03-23 ENCOUNTER — Telehealth: Payer: Self-pay | Admitting: Family Medicine

## 2020-03-23 NOTE — Telephone Encounter (Signed)
Patient states she was covid + two weeks ago she got re-tested and still positive. She would like some advise

## 2020-03-24 NOTE — Telephone Encounter (Signed)
Some people will test positive for many weeks after initial infection due to residual viral particle but not necessarily active infection. If she is feeling better no further intervention is needed. If she is still feeling poorly she will need a VV to discuss symptoms and next steps.

## 2020-03-24 NOTE — Telephone Encounter (Signed)
DOD: Please advise 

## 2020-03-25 NOTE — Telephone Encounter (Signed)
Spoke with patient. Pt states she is feeling fine. She has another test scheduled for Monday next week. I advised patient if results are positive to call us to set up a virtual to discuss further.

## 2020-04-02 ENCOUNTER — Other Ambulatory Visit: Payer: Self-pay | Admitting: Cardiovascular Disease

## 2020-05-06 ENCOUNTER — Other Ambulatory Visit: Payer: Self-pay | Admitting: Cardiovascular Disease

## 2020-05-14 ENCOUNTER — Other Ambulatory Visit: Payer: Self-pay

## 2020-05-14 ENCOUNTER — Ambulatory Visit: Payer: 59 | Admitting: Family Medicine

## 2020-05-14 ENCOUNTER — Encounter: Payer: Self-pay | Admitting: Family Medicine

## 2020-05-14 VITALS — BP 130/82 | HR 79 | Resp 16 | Ht 64.0 in | Wt 159.9 lb

## 2020-05-14 DIAGNOSIS — Z79899 Other long term (current) drug therapy: Secondary | ICD-10-CM | POA: Diagnosis not present

## 2020-05-14 DIAGNOSIS — E785 Hyperlipidemia, unspecified: Secondary | ICD-10-CM

## 2020-05-14 DIAGNOSIS — Z7189 Other specified counseling: Secondary | ICD-10-CM

## 2020-05-14 DIAGNOSIS — F419 Anxiety disorder, unspecified: Secondary | ICD-10-CM

## 2020-05-14 DIAGNOSIS — I10 Essential (primary) hypertension: Secondary | ICD-10-CM

## 2020-05-14 MED ORDER — AMLODIPINE BESYLATE 5 MG PO TABS: 5.0000 mg | ORAL_TABLET | Freq: Every day | ORAL | 1 refills | Status: DC

## 2020-05-14 MED ORDER — ALPRAZOLAM 0.5 MG PO TABS: 0.5000 mg | ORAL_TABLET | Freq: Every day | ORAL | 2 refills | Status: DC | PRN

## 2020-05-14 MED ORDER — CHLORTHALIDONE 25 MG PO TABS: 25.0000 mg | ORAL_TABLET | Freq: Every day | ORAL | 3 refills | Status: DC

## 2020-05-14 NOTE — Patient Instructions (Signed)
DASH Eating Plan DASH stands for "Dietary Approaches to Stop Hypertension." The DASH eating plan is a healthy eating plan that has been shown to reduce high blood pressure (hypertension). It may also reduce your risk for type 2 diabetes, heart disease, and stroke. The DASH eating plan may also help with weight loss. What are tips for following this plan?  General guidelines  Avoid eating more than 2,300 mg (milligrams) of salt (sodium) a day. If you have hypertension, you may need to reduce your sodium intake to 1,500 mg a day.  Limit alcohol intake to no more than 1 drink a day for nonpregnant women and 2 drinks a day for men. One drink equals 12 oz of beer, 5 oz of wine, or 1 oz of hard liquor.  Work with your health care provider to maintain a healthy body weight or to lose weight. Ask what an ideal weight is for you.  Get at least 30 minutes of exercise that causes your heart to beat faster (aerobic exercise) most days of the week. Activities may include walking, swimming, or biking.  Work with your health care provider or diet and nutrition specialist (dietitian) to adjust your eating plan to your individual calorie needs. Reading food labels   Check food labels for the amount of sodium per serving. Choose foods with less than 5 percent of the Daily Value of sodium. Generally, foods with less than 300 mg of sodium per serving fit into this eating plan.  To find whole grains, look for the word "whole" as the first word in the ingredient list. Shopping  Buy products labeled as "low-sodium" or "no salt added."  Buy fresh foods. Avoid canned foods and premade or frozen meals. Cooking  Avoid adding salt when cooking. Use salt-free seasonings or herbs instead of table salt or sea salt. Check with your health care provider or pharmacist before using salt substitutes.  Do not fry foods. Cook foods using healthy methods such as baking, boiling, grilling, and broiling instead.  Cook with  heart-healthy oils, such as olive, canola, soybean, or sunflower oil. Meal planning  Eat a balanced diet that includes: ? 5 or more servings of fruits and vegetables each day. At each meal, try to fill half of your plate with fruits and vegetables. ? Up to 6-8 servings of whole grains each day. ? Less than 6 oz of lean meat, poultry, or fish each day. A 3-oz serving of meat is about the same size as a deck of cards. One egg equals 1 oz. ? 2 servings of low-fat dairy each day. ? A serving of nuts, seeds, or beans 5 times each week. ? Heart-healthy fats. Healthy fats called Omega-3 fatty acids are found in foods such as flaxseeds and coldwater fish, like sardines, salmon, and mackerel.  Limit how much you eat of the following: ? Canned or prepackaged foods. ? Food that is high in trans fat, such as fried foods. ? Food that is high in saturated fat, such as fatty meat. ? Sweets, desserts, sugary drinks, and other foods with added sugar. ? Full-fat dairy products.  Do not salt foods before eating.  Try to eat at least 2 vegetarian meals each week.  Eat more home-cooked food and less restaurant, buffet, and fast food.  When eating at a restaurant, ask that your food be prepared with less salt or no salt, if possible. What foods are recommended? The items listed may not be a complete list. Talk with your dietitian about   what dietary choices are best for you. Grains Whole-grain or whole-wheat bread. Whole-grain or whole-wheat pasta. Brown rice. Oatmeal. Quinoa. Bulgur. Whole-grain and low-sodium cereals. Pita bread. Low-fat, low-sodium crackers. Whole-wheat flour tortillas. Vegetables Fresh or frozen vegetables (raw, steamed, roasted, or grilled). Low-sodium or reduced-sodium tomato and vegetable juice. Low-sodium or reduced-sodium tomato sauce and tomato paste. Low-sodium or reduced-sodium canned vegetables. Fruits All fresh, dried, or frozen fruit. Canned fruit in natural juice (without  added sugar). Meat and other protein foods Skinless chicken or turkey. Ground chicken or turkey. Pork with fat trimmed off. Fish and seafood. Egg whites. Dried beans, peas, or lentils. Unsalted nuts, nut butters, and seeds. Unsalted canned beans. Lean cuts of beef with fat trimmed off. Low-sodium, lean deli meat. Dairy Low-fat (1%) or fat-free (skim) milk. Fat-free, low-fat, or reduced-fat cheeses. Nonfat, low-sodium ricotta or cottage cheese. Low-fat or nonfat yogurt. Low-fat, low-sodium cheese. Fats and oils Soft margarine without trans fats. Vegetable oil. Low-fat, reduced-fat, or light mayonnaise and salad dressings (reduced-sodium). Canola, safflower, olive, soybean, and sunflower oils. Avocado. Seasoning and other foods Herbs. Spices. Seasoning mixes without salt. Unsalted popcorn and pretzels. Fat-free sweets. What foods are not recommended? The items listed may not be a complete list. Talk with your dietitian about what dietary choices are best for you. Grains Baked goods made with fat, such as croissants, muffins, or some breads. Dry pasta or rice meal packs. Vegetables Creamed or fried vegetables. Vegetables in a cheese sauce. Regular canned vegetables (not low-sodium or reduced-sodium). Regular canned tomato sauce and paste (not low-sodium or reduced-sodium). Regular tomato and vegetable juice (not low-sodium or reduced-sodium). Pickles. Olives. Fruits Canned fruit in a light or heavy syrup. Fried fruit. Fruit in cream or butter sauce. Meat and other protein foods Fatty cuts of meat. Ribs. Fried meat. Bacon. Sausage. Bologna and other processed lunch meats. Salami. Fatback. Hotdogs. Bratwurst. Salted nuts and seeds. Canned beans with added salt. Canned or smoked fish. Whole eggs or egg yolks. Chicken or turkey with skin. Dairy Whole or 2% milk, cream, and half-and-half. Whole or full-fat cream cheese. Whole-fat or sweetened yogurt. Full-fat cheese. Nondairy creamers. Whipped toppings.  Processed cheese and cheese spreads. Fats and oils Butter. Stick margarine. Lard. Shortening. Ghee. Bacon fat. Tropical oils, such as coconut, palm kernel, or palm oil. Seasoning and other foods Salted popcorn and pretzels. Onion salt, garlic salt, seasoned salt, table salt, and sea salt. Worcestershire sauce. Tartar sauce. Barbecue sauce. Teriyaki sauce. Soy sauce, including reduced-sodium. Steak sauce. Canned and packaged gravies. Fish sauce. Oyster sauce. Cocktail sauce. Horseradish that you find on the shelf. Ketchup. Mustard. Meat flavorings and tenderizers. Bouillon cubes. Hot sauce and Tabasco sauce. Premade or packaged marinades. Premade or packaged taco seasonings. Relishes. Regular salad dressings. Where to find more information:  National Heart, Lung, and Blood Institute: www.nhlbi.nih.gov  American Heart Association: www.heart.org Summary  The DASH eating plan is a healthy eating plan that has been shown to reduce high blood pressure (hypertension). It may also reduce your risk for type 2 diabetes, heart disease, and stroke.  With the DASH eating plan, you should limit salt (sodium) intake to 2,300 mg a day. If you have hypertension, you may need to reduce your sodium intake to 1,500 mg a day.  When on the DASH eating plan, aim to eat more fresh fruits and vegetables, whole grains, lean proteins, low-fat dairy, and heart-healthy fats.  Work with your health care provider or diet and nutrition specialist (dietitian) to adjust your eating plan to your   individual calorie needs. This information is not intended to replace advice given to you by your health care provider. Make sure you discuss any questions you have with your health care provider. Document Revised: 06/15/2017 Document Reviewed: 06/26/2016 Elsevier Patient Education  2020 Elsevier Inc.  

## 2020-05-14 NOTE — Assessment & Plan Note (Signed)
Encouraged heart healthy diet, increase exercise, avoid trans fats, consider a krill oil cap daily con't lipitor

## 2020-05-14 NOTE — Assessment & Plan Note (Signed)
And vaccine Pt had covid after having both pfizer vaccines  She plans on getting the booster

## 2020-05-14 NOTE — Assessment & Plan Note (Signed)
Well controlled, no changes to meds. Encouraged heart healthy diet such as the DASH diet and exercise as tolerated.  con't norvasc and losartan F/u 6 months

## 2020-05-14 NOTE — Progress Notes (Signed)
Patient ID: Jean Blair, female    DOB: [birth year]  Age: 59 y.o. MRN: [id number]    Subjective:  Subjective  HPI Jean Blair presents for f/u bp and chol.  No complaints  She needs refills and labs Pt had covid this summer but only had mild symptoms   Review of Systems  Constitutional: Negative for appetite change, diaphoresis, fatigue and unexpected weight change.  Eyes: Negative for pain, redness and visual disturbance.  Respiratory: Negative for cough, chest tightness, shortness of breath and wheezing.   Cardiovascular: Negative for chest pain, palpitations and leg swelling.  Endocrine: Negative for cold intolerance, heat intolerance, polydipsia, polyphagia and polyuria.  Genitourinary: Negative for difficulty urinating, dysuria and frequency.  Neurological: Negative for dizziness, light-headedness, numbness and headaches.    History Past Medical History:  Diagnosis Date  . Adenomatous polyps   . Anemia   . Anxiety   . Cataract    forming   . Hyperlipidemia   . Hypertension     She has a past surgical history that includes Tubal ligation (1991); Colonoscopy; Polypectomy; and Upper gastrointestinal endoscopy.   Her family history includes Alcohol abuse in an other family member; Anxiety disorder in an other family member; Bladder Cancer in her brother; Cancer in her brother; Diabetes in her mother and another family member; Heart attack in her maternal grandfather; Heart disease in her maternal grandfather; Hypertension in an other family member; Mental illness in an other family member; Stroke in an other family member.She reports that she quit smoking about 19 years ago. Her smoking use included cigarettes. She has never used smokeless tobacco. She reports current alcohol use of about 7.0 standard drinks of alcohol per week. She reports that she does not use drugs.  Current Outpatient Medications on File Prior to Visit  Medication Sig Dispense Refill  .  atorvastatin (LIPITOR) 40 MG tablet TAKE 1 TABLET BY MOUTH DAILY. <PLEASE MAKE APPOINTMENT FOR REFILLS> 90 tablet 1  . losartan (COZAAR) 50 MG tablet Take 1 tablet (50 mg total) by mouth daily. 90 tablet 1   No current facility-administered medications on file prior to visit.     Objective:  Objective  Physical Exam Vitals and nursing note reviewed.  Constitutional:      Appearance: She is well-developed.  HENT:     Head: Normocephalic and atraumatic.  Eyes:     Conjunctiva/sclera: Conjunctivae normal.  Neck:     Thyroid: No thyromegaly.     Vascular: No carotid bruit or JVD.  Cardiovascular:     Rate and Rhythm: Normal rate and regular rhythm.     Heart sounds: Normal heart sounds. No murmur heard.   Pulmonary:     Effort: Pulmonary effort is normal. No respiratory distress.     Breath sounds: Normal breath sounds. No wheezing or rales.  Chest:     Chest wall: No tenderness.  Musculoskeletal:     Cervical back: Normal range of motion and neck supple.  Neurological:     Mental Status: She is alert and oriented to person, place, and time.    BP 130/82 (BP Location: Left Arm, Patient Position: Sitting, Cuff Size: Normal)   Pulse 79   Resp 16   LMP 05/17/2015   SpO2 99%  Wt Readings from Last 3 Encounters:  11/11/19 159 lb 9.6 oz (72.4 kg)  07/16/19 160 lb (72.6 kg)  07/02/19 160 lb (72.6 kg)     Lab Results  Component Value Date   WBC 8.8  11/26/2019   HGB 14.5 11/26/2019   HCT 41.7 11/26/2019   PLT 325.0 11/26/2019   GLUCOSE 106 (H) 11/26/2019   CHOL 179 11/26/2019   TRIG 113.0 11/26/2019   HDL 43.60 11/26/2019   LDLDIRECT 208.0 08/27/2015   LDLCALC 112 (H) 11/26/2019   ALT 24 11/26/2019   AST 18 11/26/2019   NA 140 11/26/2019   K 3.6 11/26/2019   CL 101 11/26/2019   CREATININE 0.77 11/26/2019   BUN 16 11/26/2019   CO2 30 11/26/2019   TSH 1.79 05/13/2019    NM Hepato W/EjeCT Fract  Result Date: 06/18/2019 CLINICAL DATA:  Abdominal cramping and  upper back pain for 2.5 months, biliary sludge EXAM: NUCLEAR MEDICINE HEPATOBILIARY IMAGING WITH GALLBLADDER EF TECHNIQUE: Sequential images of the abdomen were obtained out to 60 minutes following intravenous administration of radiopharmaceutical. After oral ingestion of Ensure, gallbladder ejection fraction was determined. At 60 min, normal ejection fraction is greater than 33%. RADIOPHARMACEUTICALS:  5.43 mCi Tc-74m  Choletec IV COMPARISON:  None FINDINGS: Normal tracer extraction from bloodstream indicating normal hepatocellular function. Normal excretion of tracer into biliary tree. Gallbladder visualized at 13 min. Small bowel visualized at 35 min. No hepatic retention of tracer. Subjectively normal emptying of tracer from gallbladder following fatty meal stimulation. Calculated gallbladder ejection fraction is 75%, normal. Patient reported no symptoms following Ensure ingestion. Normal gallbladder ejection fraction following Ensure ingestion is greater than 33% at 1 hour. IMPRESSION: Normal exam. Electronically Signed   By: Lavonia Dana M.D.   On: 06/18/2019 15:48     Assessment & Plan:  Plan  I have discontinued Thomes Dinning. Vizzini's methocarbamol and ciclopirox. I have also changed her chlorthalidone and amLODipine. Additionally, I am having her maintain her losartan, atorvastatin, and ALPRAZolam.  Meds ordered this encounter  Medications  . chlorthalidone (HYGROTON) 25 MG tablet    Sig: Take 1 tablet (25 mg total) by mouth daily.    Dispense:  90 tablet    Refill:  3  . ALPRAZolam (XANAX) 0.5 MG tablet    Sig: Take 1 tablet (0.5 mg total) by mouth daily as needed.    Dispense:  30 tablet    Refill:  2    This request is for a new prescription for a controlled substance as required by Federal/State law..  . amLODipine (NORVASC) 5 MG tablet    Sig: Take 1 tablet (5 mg total) by mouth daily.    Dispense:  90 tablet    Refill:  1    Problem List Items Addressed This Visit       Unprioritized   Educated about COVID-19 virus infection    And vaccine Pt had covid after having both pfizer vaccines  She plans on getting the booster       Essential hypertension    Well controlled, no changes to meds. Encouraged heart healthy diet such as the DASH diet and exercise as tolerated.  con't norvasc and losartan F/u 6 months      Relevant Medications   chlorthalidone (HYGROTON) 25 MG tablet   amLODipine (NORVASC) 5 MG tablet   Hyperlipidemia    Encouraged heart healthy diet, increase exercise, avoid trans fats, consider a krill oil cap daily con't lipitor       Relevant Medications   chlorthalidone (HYGROTON) 25 MG tablet   amLODipine (NORVASC) 5 MG tablet   Other Relevant Orders   Lipid panel   Comprehensive metabolic panel    Other Visit Diagnoses    Primary  hypertension    -  Primary   Relevant Medications   chlorthalidone (HYGROTON) 25 MG tablet   amLODipine (NORVASC) 5 MG tablet   Other Relevant Orders   Lipid panel   Comprehensive metabolic panel   Anxiety       Relevant Medications   ALPRAZolam (XANAX) 0.5 MG tablet   High risk medication use       Relevant Orders   DRUG MONITORING, PANEL 8 WITH CONFIRMATION, URINE      Follow-up: Return in about 6 months (around 11/12/2020), or if symptoms worsen or fail to improve, for annual exam, fasting.  Ann Held, DO

## 2020-05-15 ENCOUNTER — Other Ambulatory Visit: Payer: Self-pay | Admitting: Family Medicine

## 2020-05-15 DIAGNOSIS — E785 Hyperlipidemia, unspecified: Secondary | ICD-10-CM

## 2020-05-15 LAB — LIPID PANEL
Cholesterol: 229 mg/dL — ABNORMAL HIGH (ref <200)
HDL: 44 mg/dL — ABNORMAL LOW (ref >=50)
LDL Cholesterol (Calc): 145 mg/dL (calc) — ABNORMAL HIGH
Non-HDL Cholesterol (Calc): 185 mg/dL (calc) — ABNORMAL HIGH (ref <130)
Total CHOL/HDL Ratio: 5.2 (calc) — ABNORMAL HIGH (ref <5.0)
Triglycerides: 258 mg/dL — ABNORMAL HIGH (ref <150)

## 2020-05-15 LAB — COMPREHENSIVE METABOLIC PANEL
AG Ratio: 1.6 (calc) (ref 1.0–2.5)
ALT: 11 U/L (ref 6–29)
AST: 13 U/L (ref 10–35)
Albumin: 4.2 g/dL (ref 3.6–5.1)
Alkaline phosphatase (APISO): 66 U/L (ref 37–153)
BUN: 9 mg/dL (ref 7–25)
CO2: 28 mmol/L (ref 20–32)
Calcium: 10.3 mg/dL (ref 8.6–10.4)
Chloride: 103 mmol/L (ref 98–110)
Creat: 0.65 mg/dL (ref 0.50–1.05)
Globulin: 2.6 g/dL (calc) (ref 1.9–3.7)
Glucose, Bld: 102 mg/dL — ABNORMAL HIGH (ref 65–99)
Potassium: 3.6 mmol/L (ref 3.5–5.3)
Sodium: 141 mmol/L (ref 135–146)
Total Bilirubin: 0.4 mg/dL (ref 0.2–1.2)
Total Protein: 6.8 g/dL (ref 6.1–8.1)

## 2020-05-16 LAB — DRUG MONITORING, PANEL 8 WITH CONFIRMATION, URINE
6 Acetylmorphine: NEGATIVE ng/mL (ref <10)
Alcohol Metabolites: POSITIVE ng/mL — AB
Amphetamines: NEGATIVE ng/mL (ref <500)
Benzodiazepines: NEGATIVE ng/mL (ref <100)
Buprenorphine, Urine: NEGATIVE ng/mL (ref <5)
Cocaine Metabolite: NEGATIVE ng/mL (ref <150)
Creatinine: 149.4 mg/dL
Ethyl Glucuronide (ETG): 2824 ng/mL — ABNORMAL HIGH (ref <500)
Ethyl Sulfate (ETS): 992 ng/mL — ABNORMAL HIGH (ref <100)
MDMA: NEGATIVE ng/mL (ref <500)
Marijuana Metabolite: NEGATIVE ng/mL (ref <20)
Opiates: NEGATIVE ng/mL (ref <100)
Oxidant: NEGATIVE ug/mL
Oxycodone: NEGATIVE ng/mL (ref <100)
pH: 7.6 (ref 4.5–9.0)

## 2020-05-16 LAB — DM TEMPLATE

## 2020-06-21 LAB — HM MAMMOGRAPHY

## 2020-07-25 ENCOUNTER — Other Ambulatory Visit: Payer: Self-pay | Admitting: Family Medicine

## 2020-07-25 DIAGNOSIS — I10 Essential (primary) hypertension: Secondary | ICD-10-CM

## 2020-10-29 ENCOUNTER — Other Ambulatory Visit: Payer: Self-pay | Admitting: Cardiovascular Disease

## 2020-10-29 ENCOUNTER — Other Ambulatory Visit: Payer: Self-pay | Admitting: Family Medicine

## 2020-10-29 DIAGNOSIS — I10 Essential (primary) hypertension: Secondary | ICD-10-CM

## 2020-11-15 ENCOUNTER — Other Ambulatory Visit: Payer: Self-pay

## 2020-11-15 ENCOUNTER — Encounter: Payer: Self-pay | Admitting: Family Medicine

## 2020-11-15 ENCOUNTER — Ambulatory Visit (INDEPENDENT_AMBULATORY_CARE_PROVIDER_SITE_OTHER): Payer: Managed Care, Other (non HMO) | Admitting: Family Medicine

## 2020-11-15 VITALS — BP 122/82 | HR 68 | Temp 98.6°F | Resp 18 | Ht 64.0 in | Wt 169.2 lb

## 2020-11-15 DIAGNOSIS — E785 Hyperlipidemia, unspecified: Secondary | ICD-10-CM

## 2020-11-15 DIAGNOSIS — R1013 Epigastric pain: Secondary | ICD-10-CM

## 2020-11-15 DIAGNOSIS — I10 Essential (primary) hypertension: Secondary | ICD-10-CM

## 2020-11-15 DIAGNOSIS — K589 Irritable bowel syndrome without diarrhea: Secondary | ICD-10-CM | POA: Diagnosis not present

## 2020-11-15 DIAGNOSIS — K635 Polyp of colon: Secondary | ICD-10-CM | POA: Diagnosis not present

## 2020-11-15 DIAGNOSIS — Z0000 Encounter for general adult medical examination without abnormal findings: Secondary | ICD-10-CM | POA: Diagnosis not present

## 2020-11-15 LAB — LIPID PANEL
Cholesterol: 186 mg/dL (ref 0–200)
HDL: 47.8 mg/dL (ref >39.00)
LDL Cholesterol: 115 mg/dL — ABNORMAL HIGH (ref 0–99)
NonHDL: 137.97
Total CHOL/HDL Ratio: 4
Triglycerides: 115 mg/dL (ref 0.0–149.0)
VLDL: 23 mg/dL (ref 0.0–40.0)

## 2020-11-15 LAB — CBC WITH DIFFERENTIAL/PLATELET
Basophils Absolute: 0 10*3/uL (ref 0.0–0.1)
Basophils Relative: 0.2 % (ref 0.0–3.0)
Eosinophils Absolute: 0.2 10*3/uL (ref 0.0–0.7)
Eosinophils Relative: 1.4 % (ref 0.0–5.0)
HCT: 42.4 % (ref 36.0–46.0)
Hemoglobin: 14.4 g/dL (ref 12.0–15.0)
Lymphocytes Relative: 22.5 % (ref 12.0–46.0)
Lymphs Abs: 2.8 10*3/uL (ref 0.7–4.0)
MCHC: 33.9 g/dL (ref 30.0–36.0)
MCV: 89.9 fl (ref 78.0–100.0)
Monocytes Absolute: 0.6 10*3/uL (ref 0.1–1.0)
Monocytes Relative: 5 % (ref 3.0–12.0)
Neutro Abs: 8.7 10*3/uL — ABNORMAL HIGH (ref 1.4–7.7)
Neutrophils Relative %: 70.9 % (ref 43.0–77.0)
Platelets: 375 10*3/uL (ref 150.0–400.0)
RBC: 4.71 Mil/uL (ref 3.87–5.11)
RDW: 13.5 % (ref 11.5–15.5)
WBC: 12.2 10*3/uL — ABNORMAL HIGH (ref 4.0–10.5)

## 2020-11-15 LAB — COMPREHENSIVE METABOLIC PANEL
ALT: 17 U/L (ref 0–35)
AST: 14 U/L (ref 0–37)
Albumin: 4.3 g/dL (ref 3.5–5.2)
Alkaline Phosphatase: 61 U/L (ref 39–117)
BUN: 7 mg/dL (ref 6–23)
CO2: 29 mEq/L (ref 19–32)
Calcium: 10.1 mg/dL (ref 8.4–10.5)
Chloride: 102 mEq/L (ref 96–112)
Creatinine, Ser: 0.73 mg/dL (ref 0.40–1.20)
GFR: 91.02 mL/min (ref >60.00)
Glucose, Bld: 103 mg/dL — ABNORMAL HIGH (ref 70–99)
Potassium: 3.7 mEq/L (ref 3.5–5.1)
Sodium: 141 mEq/L (ref 135–145)
Total Bilirubin: 0.4 mg/dL (ref 0.2–1.2)
Total Protein: 6.8 g/dL (ref 6.0–8.3)

## 2020-11-15 LAB — TSH: TSH: 1.77 u[IU]/mL (ref 0.35–4.50)

## 2020-11-15 MED ORDER — FAMOTIDINE 20 MG PO TABS: 20.0000 mg | ORAL_TABLET | Freq: Two times a day (BID) | ORAL | 3 refills | Status: DC

## 2020-11-15 NOTE — Patient Instructions (Signed)
Preventive Care 84-58 Years Old, Female Preventive care refers to lifestyle choices and visits with your health care provider that can promote health and wellness. This includes:  A yearly physical exam. This is also called an annual wellness visit.  Regular dental and eye exams.  Immunizations.  Screening for certain conditions.  Healthy lifestyle choices, such as: ? Eating a healthy diet. ? Getting regular exercise. ? Not using drugs or products that contain nicotine and tobacco. ? Limiting alcohol use. What can I expect for my preventive care visit? Physical exam Your health care provider will check your:  Height and weight. These may be used to calculate your BMI (body mass index). BMI is a measurement that tells if you are at a healthy weight.  Heart rate and blood pressure.  Body temperature.  Skin for abnormal spots. Counseling Your health care provider may ask you questions about your:  Past medical problems.  Family's medical history.  Alcohol, tobacco, and drug use.  Emotional well-being.  Home life and relationship well-being.  Sexual activity.  Diet, exercise, and sleep habits.  Work and work Statistician.  Access to firearms.  Method of birth control.  Menstrual cycle.  Pregnancy history. What immunizations do I need? Vaccines are usually given at various ages, according to a schedule. Your health care provider will recommend vaccines for you based on your age, medical history, and lifestyle or other factors, such as travel or where you work.   What tests do I need? Blood tests  Lipid and cholesterol levels. These may be checked every 5 years, or more often if you are over 58 years old.  Hepatitis C test.  Hepatitis B test. Screening  Lung cancer screening. You may have this screening every year starting at age 58 if you have a 30-pack-year history of smoking and currently smoke or have quit within the past 15 years.  Colorectal cancer  screening. ? All adults should have this screening starting at age 58 and continuing until age 17. ? Your health care provider may recommend screening at age 58 if you are at increased risk. ? You will have tests every 1-10 years, depending on your results and the type of screening test.  Diabetes screening. ? This is done by checking your blood sugar (glucose) after you have not eaten for a while (fasting). ? You may have this done every 1-3 years.  Mammogram. ? This may be done every 1-2 years. ? Talk with your health care provider about when you should start having regular mammograms. This may depend on whether you have a family history of breast cancer.  BRCA-related cancer screening. This may be done if you have a family history of breast, ovarian, tubal, or peritoneal cancers.  Pelvic exam and Pap test. ? This may be done every 3 years starting at age 58. ? Starting at age 58, this may be done every 5 years if you have a Pap test in combination with an HPV test. Other tests  STD (sexually transmitted disease) testing, if you are at risk.  Bone density scan. This is done to screen for osteoporosis. You may have this scan if you are at high risk for osteoporosis. Talk with your health care provider about your test results, treatment options, and if necessary, the need for more tests. Follow these instructions at home: Eating and drinking  Eat a diet that includes fresh fruits and vegetables, whole grains, lean protein, and low-fat dairy products.  Take vitamin and mineral supplements  as recommended by your health care provider.  Do not drink alcohol if: ? Your health care provider tells you not to drink. ? You are pregnant, may be pregnant, or are planning to become pregnant.  If you drink alcohol: ? Limit how much you have to 0-1 drink a day. ? Be aware of how much alcohol is in your drink. In the [place] one drink equals one 12 oz bottle of beer (355 mL), one 5 oz glass of  wine (148 mL), or one 1 oz glass of hard liquor (44 mL).   Lifestyle  Take daily care of your teeth and gums. Brush your teeth every morning and night with fluoride toothpaste. Floss one time each day.  Stay active. Exercise for at least 30 minutes 5 or more days each week.  Do not use any products that contain nicotine or tobacco, such as cigarettes, e-cigarettes, and chewing tobacco. If you need help quitting, ask your health care provider.  Do not use drugs.  If you are sexually active, practice safe sex. Use a condom or other form of protection to prevent STIs (sexually transmitted infections).  If you do not wish to become pregnant, use a form of birth control. If you plan to become pregnant, see your health care provider for a prepregnancy visit.  If told by your health care provider, take low-dose aspirin daily starting at age 58.  Find healthy ways to cope with stress, such as: ? Meditation, yoga, or listening to music. ? Journaling. ? Talking to a trusted person. ? Spending time with friends and family. Safety  Always wear your seat belt while driving or riding in a vehicle.  Do not drive: ? If you have been drinking alcohol. Do not ride with someone who has been drinking. ? When you are tired or distracted. ? While texting.  Wear a helmet and other protective equipment during sports activities.  If you have firearms in your house, make sure you follow all gun safety procedures. What's next?  Visit your health care provider once a year for an annual wellness visit.  Ask your health care provider how often you should have your eyes and teeth checked.  Stay up to date on all vaccines. This information is not intended to replace advice given to you by your health care provider. Make sure you discuss any questions you have with your health care provider. Document Revised: 04/06/2020 Document Reviewed: 03/14/2018 Elsevier Patient Education  2021 [employer] Inc.  

## 2020-11-15 NOTE — Assessment & Plan Note (Signed)
Encouraged heart healthy diet, increase exercise, avoid trans fats, consider a krill oil cap daily 

## 2020-11-15 NOTE — Assessment & Plan Note (Signed)
Well controlled, no changes to meds. Encouraged heart healthy diet such as the DASH diet and exercise as tolerated.  °

## 2020-11-15 NOTE — Assessment & Plan Note (Signed)
Pain has resolved  Pt has hx colon polyps and recent dx ibs---  She requests GI referral

## 2020-11-15 NOTE — Progress Notes (Signed)
Patient ID: Jean Blair, female    DOB: 1961/03/25  Age: 60 y.o. MRN: [id number]    Subjective:  Subjective  HPI Jean Blair presents for a comprehensive physical examination today. She complains of intermittent left lower abdominal pain secondary to her Dx of IBS. She reports that she experiences discomfort up to her chest, however after taking Tums, her symptoms relieved. She also complains of bilateral LE swelling, tightness, and pain. She reports that it maybe the result from her sitting all day due to her job. She endorses having pain in the region when she exercises. She notes that sometimes her left toe has a purple hue to it, due to loss of circulation. She denies any SOB, fever, cough, chills, sore throat, dysuria, urinary incontinence, back pain, HA, or N/V/D at this time.   Review of Systems  Constitutional: Negative for chills, fatigue and fever.  HENT: Negative for congestion, ear pain, sinus pressure, sinus pain, sore throat and tinnitus.   Eyes: Negative for pain.  Respiratory: Negative for cough and shortness of breath.   Cardiovascular: Positive for leg swelling (Bilaterial ). Negative for chest pain and palpitations.  Gastrointestinal: Positive for abdominal pain (left lower abdominal pain secondary to IBS ). Negative for blood in stool, constipation, diarrhea, nausea and vomiting.  Genitourinary: Negative for dysuria, frequency, hematuria and urgency.  Musculoskeletal: Negative for back pain.       Bilateral LE tightness and pain   Skin: Positive for color change (left toe (Purple)). Negative for rash.  Allergic/Immunologic: Negative for environmental allergies.  Neurological: Negative for dizziness, weakness and headaches.    History Past Medical History:  Diagnosis Date  . Adenomatous polyps   . Anemia   . Anxiety   . Cataract    forming   . Hyperlipidemia   . Hypertension     She has a past surgical history that includes Tubal ligation (1991);  Colonoscopy; Polypectomy; and Upper gastrointestinal endoscopy.   Her family history includes Alcohol abuse in an other family member; Anxiety disorder in an other family member; Bladder Cancer in her brother; Cancer in her brother; Diabetes in her mother and another family member; Heart attack in her maternal grandfather; Heart disease in her maternal grandfather; Hypertension in an other family member; Mental illness in an other family member; Stroke in an other family member.She reports that she quit smoking about 20 years ago. Her smoking use included cigarettes. She has never used smokeless tobacco. She reports current alcohol use of about 7.0 standard drinks of alcohol per week. She reports that she does not use drugs.  Current Outpatient Medications on File Prior to Visit  Medication Sig Dispense Refill  . ALPRAZolam (XANAX) 0.5 MG tablet Take 1 tablet (0.5 mg total) by mouth daily as needed. 30 tablet 2  . amLODipine (NORVASC) 5 MG tablet Take 1 tablet (5 mg total) by mouth daily. 90 tablet 1  . atorvastatin (LIPITOR) 40 MG tablet TAKE 1 TABLET BY MOUTH DAILY. <PLEASE MAKE APPOINTMENT FOR REFILLS> 90 tablet 1  . chlorthalidone (HYGROTON) 25 MG tablet Take 1 tablet (25 mg total) by mouth daily. 90 tablet 3  . losartan (COZAAR) 50 MG tablet Take 1 tablet (50 mg total) by mouth daily. 90 tablet 1   No current facility-administered medications on file prior to visit.     Objective:  Objective  Physical Exam Vitals and nursing note reviewed.  Constitutional:      General: She is not in acute distress.  Appearance: Normal appearance. She is well-developed. She is not ill-appearing.  HENT:     Head: Normocephalic and atraumatic.     Right Ear: External ear normal.     Left Ear: External ear normal.     Nose: Nose normal.  Eyes:     Extraocular Movements: Extraocular movements intact.     Pupils: Pupils are equal, round, and reactive to light.  Cardiovascular:     Rate and Rhythm:  Normal rate and regular rhythm.     Pulses: Normal pulses.     Heart sounds: Normal heart sounds. No murmur heard. No friction rub. No gallop.   Pulmonary:     Effort: Pulmonary effort is normal. No respiratory distress.     Breath sounds: Normal breath sounds. No wheezing, rhonchi or rales.  Chest:     Chest wall: No tenderness.  Abdominal:     General: Bowel sounds are normal. There is no distension.     Palpations: Abdomen is soft. There is no mass.     Tenderness: There is no abdominal tenderness. There is no guarding or rebound.     Hernia: No hernia is present.  Musculoskeletal:        General: Normal range of motion.     Cervical back: Normal range of motion and neck supple.     Right lower leg: No edema.     Left lower leg: No edema.  Skin:    General: Skin is warm and dry.  Neurological:     Mental Status: She is alert and oriented to person, place, and time.  Psychiatric:        Behavior: Behavior normal.        Thought Content: Thought content normal.    BP 122/82 (BP Location: Right Arm, Patient Position: Sitting, Cuff Size: Normal)   Pulse 68   Temp 98.6 F (37 C) (Oral)   Resp 18   Ht 5\' 4"  (1.626 m)   Wt 169 lb 3.2 oz (76.7 kg)   LMP 05/17/2015   SpO2 99%   BMI 29.04 kg/m  Wt Readings from Last 3 Encounters:  11/15/20 169 lb 3.2 oz (76.7 kg)  05/14/20 159 lb 14.4 oz (72.5 kg)  11/11/19 159 lb 9.6 oz (72.4 kg)     Lab Results  Component Value Date   WBC 12.2 (H) 11/15/2020   HGB 14.4 11/15/2020   HCT 42.4 11/15/2020   PLT 375.0 11/15/2020   GLUCOSE 103 (H) 11/15/2020   CHOL 186 11/15/2020   TRIG 115.0 11/15/2020   HDL 47.80 11/15/2020   LDLDIRECT 208.0 08/27/2015   LDLCALC 115 (H) 11/15/2020   ALT 17 11/15/2020   AST 14 11/15/2020   NA 141 11/15/2020   K 3.7 11/15/2020   CL 102 11/15/2020   CREATININE 0.73 11/15/2020   BUN 7 11/15/2020   CO2 29 11/15/2020   TSH 1.77 11/15/2020    NM Hepato W/EjeCT Fract  Result Date:  06/18/2019 CLINICAL DATA:  Abdominal cramping and upper back pain for 2.5 months, biliary sludge EXAM: NUCLEAR MEDICINE HEPATOBILIARY IMAGING WITH GALLBLADDER EF TECHNIQUE: Sequential images of the abdomen were obtained out to 60 minutes following intravenous administration of radiopharmaceutical. After oral ingestion of Ensure, gallbladder ejection fraction was determined. At 60 min, normal ejection fraction is greater than 33%. RADIOPHARMACEUTICALS:  5.43 mCi Tc-64m  Choletec IV COMPARISON:  None FINDINGS: Normal tracer extraction from bloodstream indicating normal hepatocellular function. Normal excretion of tracer into biliary tree. Gallbladder visualized at 13 min.  Small bowel visualized at 35 min. No hepatic retention of tracer. Subjectively normal emptying of tracer from gallbladder following fatty meal stimulation. Calculated gallbladder ejection fraction is 75%, normal. Patient reported no symptoms following Ensure ingestion. Normal gallbladder ejection fraction following Ensure ingestion is greater than 33% at 1 hour. IMPRESSION: Normal exam. Electronically Signed   By: Lavonia Dana M.D.   On: 06/18/2019 15:48     Assessment & Plan:  Plan    Meds ordered this encounter  Medications  . famotidine (PEPCID) 20 MG tablet    Sig: Take 1 tablet (20 mg total) by mouth 2 (two) times daily.    Dispense:  60 tablet    Refill:  3    Problem List Items Addressed This Visit      Unprioritized   Dyspepsia    pepcid bid  Refer to GI       Relevant Medications   famotidine (PEPCID) 20 MG tablet   Essential hypertension    Well controlled, no changes to meds. Encouraged heart healthy diet such as the DASH diet and exercise as tolerated.       Hyperlipidemia    Encouraged heart healthy diet, increase exercise, avoid trans fats, consider a krill oil cap daily      Relevant Orders   TSH (Completed)   CBC with Differential/Platelet (Completed)   Lipid panel (Completed)   Comprehensive  metabolic panel (Completed)   Irritable bowel syndrome without diarrhea    Pain has resolved  Pt has hx colon polyps and recent dx ibs---  She requests GI referral       Relevant Medications   famotidine (PEPCID) 20 MG tablet   Other Relevant Orders   Ambulatory referral to Gastroenterology   Preventative health care - Primary    ghm utd Check labs  See avs       Relevant Orders   TSH (Completed)   CBC with Differential/Platelet (Completed)   Lipid panel (Completed)   Comprehensive metabolic panel (Completed)    Other Visit Diagnoses    Primary hypertension       Relevant Orders   TSH (Completed)   CBC with Differential/Platelet (Completed)   Lipid panel (Completed)   Comprehensive metabolic panel (Completed)   Polyp of colon, unspecified part of colon, unspecified type       Relevant Orders   Ambulatory referral to Gastroenterology     Dexa: Last completed on 04/07/2016, results were normal, repeat every 5 years.   Mammo: Last completed on 06/21/2020 , results were normal, repeat every 1 year.  Pap Smear: Last completed on 03/05/2014, hyperkeratosis was noted, repeat every 3 years.   Colonoscopy: Last completed on 07/16/2019, diverticulosis, polyp, and internal hemorrhoids noted, repeat every 3 years.   Follow-up: Return in about 6 months (around 05/18/2021), or if symptoms worsen or fail to improve, for hypertension, hyperlipidemia.   I,Gordon Zheng,acting as a Education administrator for [employer], DO.,have documented all relevant documentation on the behalf of Jean Held, DO,as directed by  Jean Held, DO while in the presence of Elizabeth, DO, have reviewed all documentation for this visit. The documentation on 11/15/20 for the exam, diagnosis, procedures, and orders are all accurate and complete.  Jean Held, DO

## 2020-11-15 NOTE — Assessment & Plan Note (Signed)
pepcid bid  Refer to GI

## 2020-11-15 NOTE — Assessment & Plan Note (Signed)
ghm utd Check labs  See avs  

## 2020-11-17 ENCOUNTER — Other Ambulatory Visit: Payer: Self-pay | Admitting: Family Medicine

## 2020-11-17 DIAGNOSIS — D72829 Elevated white blood cell count, unspecified: Secondary | ICD-10-CM

## 2020-11-18 ENCOUNTER — Other Ambulatory Visit: Payer: Self-pay

## 2020-11-18 DIAGNOSIS — E785 Hyperlipidemia, unspecified: Secondary | ICD-10-CM

## 2020-11-18 DIAGNOSIS — M545 Low back pain, unspecified: Secondary | ICD-10-CM

## 2020-11-18 MED ORDER — ROSUVASTATIN CALCIUM 40 MG PO TABS: 40.0000 mg | ORAL_TABLET | Freq: Every day | ORAL | 2 refills | Status: DC

## 2020-11-19 ENCOUNTER — Other Ambulatory Visit: Payer: Self-pay

## 2020-11-19 ENCOUNTER — Other Ambulatory Visit (INDEPENDENT_AMBULATORY_CARE_PROVIDER_SITE_OTHER): Payer: Managed Care, Other (non HMO)

## 2020-11-19 DIAGNOSIS — D72829 Elevated white blood cell count, unspecified: Secondary | ICD-10-CM

## 2020-11-19 DIAGNOSIS — M545 Low back pain, unspecified: Secondary | ICD-10-CM

## 2020-11-19 LAB — POCT URINALYSIS DIPSTICK
Bilirubin, UA: NEGATIVE
Glucose, UA: NEGATIVE
Ketones, UA: NEGATIVE
Leukocytes, UA: NEGATIVE
Nitrite, UA: NEGATIVE
Odor: NEGATIVE
Protein, UA: NEGATIVE
Spec Grav, UA: 1.01 (ref 1.010–1.025)
Urobilinogen, UA: 0.2 E.U./dL
pH, UA: 7 (ref 5.0–8.0)

## 2020-11-19 LAB — CBC WITH DIFFERENTIAL/PLATELET
Basophils Absolute: 0 10*3/uL (ref 0.0–0.1)
Basophils Relative: 0.4 % (ref 0.0–3.0)
Eosinophils Absolute: 0.2 10*3/uL (ref 0.0–0.7)
Eosinophils Relative: 1.9 % (ref 0.0–5.0)
HCT: 43 % (ref 36.0–46.0)
Hemoglobin: 14.9 g/dL (ref 12.0–15.0)
Lymphocytes Relative: 30.1 % (ref 12.0–46.0)
Lymphs Abs: 3.1 10*3/uL (ref 0.7–4.0)
MCHC: 34.6 g/dL (ref 30.0–36.0)
MCV: 90.3 fl (ref 78.0–100.0)
Monocytes Absolute: 0.8 10*3/uL (ref 0.1–1.0)
Monocytes Relative: 8.1 % (ref 3.0–12.0)
Neutro Abs: 6.1 10*3/uL (ref 1.4–7.7)
Neutrophils Relative %: 59.5 % (ref 43.0–77.0)
Platelets: 402 10*3/uL — ABNORMAL HIGH (ref 150.0–400.0)
RBC: 4.76 Mil/uL (ref 3.87–5.11)
RDW: 13.5 % (ref 11.5–15.5)
WBC: 10.3 10*3/uL (ref 4.0–10.5)

## 2020-11-19 NOTE — Progress Notes (Signed)
y

## 2020-11-20 LAB — URINE CULTURE
MICRO NUMBER:: 11859740
Result:: NO GROWTH
SPECIMEN QUALITY:: ADEQUATE

## 2020-12-01 ENCOUNTER — Telehealth: Payer: Self-pay | Admitting: *Deleted

## 2020-12-01 NOTE — Telephone Encounter (Signed)
Attempted to reach pt. Left detailed message on voicemail to call and cancel lab appt tomorrow morning. See below note. Also sent mychart message.

## 2020-12-01 NOTE — Telephone Encounter (Signed)
Pt does not need this lab appt. Had her rpt CBC at 11/19/20 lab appointment and WBC was normal at that time. Heather- Can you call the pt and cx the appt since I am leaving.

## 2020-12-01 NOTE — Telephone Encounter (Signed)
Pt has lab appointment tomorrow morning.  I do not see any future orders in Epic.  Scheduling note says CBC.  Please place future order or call pt to cancel appt if lab not needed at this time.

## 2020-12-02 ENCOUNTER — Other Ambulatory Visit: Payer: Managed Care, Other (non HMO)

## 2020-12-03 ENCOUNTER — Ambulatory Visit: Payer: Managed Care, Other (non HMO) | Admitting: Cardiovascular Disease

## 2020-12-03 ENCOUNTER — Encounter: Payer: Self-pay | Admitting: Cardiovascular Disease

## 2020-12-03 ENCOUNTER — Other Ambulatory Visit: Payer: Self-pay

## 2020-12-03 VITALS — BP 122/82 | HR 75 | Ht 64.0 in | Wt 168.6 lb

## 2020-12-03 DIAGNOSIS — R002 Palpitations: Secondary | ICD-10-CM

## 2020-12-03 DIAGNOSIS — I259 Chronic ischemic heart disease, unspecified: Secondary | ICD-10-CM

## 2020-12-03 DIAGNOSIS — I10 Essential (primary) hypertension: Secondary | ICD-10-CM | POA: Diagnosis not present

## 2020-12-03 DIAGNOSIS — E782 Mixed hyperlipidemia: Secondary | ICD-10-CM

## 2020-12-03 NOTE — Assessment & Plan Note (Signed)
History of palpitations in the past with event monitor performed 12/20/2016 which was entirely normal without arrhythmias.

## 2020-12-03 NOTE — Assessment & Plan Note (Signed)
History of hyperlipidemia on statin therapy which was recently changed by her PCP to high-dose rosuvastatin.  Lipid profile performed 11/15/2020 revealed total cholesterol of 186, LDL 115 and HDL 47.

## 2020-12-03 NOTE — Patient Instructions (Signed)

## 2020-12-03 NOTE — Progress Notes (Signed)
12/03/2020 Jean Blair   01/02/1961  [id number]  Primary Physician Ann Held, DO Primary Cardiologist: Lorretta Harp MD FACP, [place], [place], [place]  HPI:  Jean Blair is a 60 y.o.  mildly overweight (has lost 20 pounds intentionally over the last 2 years) divorced African-American female mother of 2 children who worked as a Media planner for [employer].  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  She was traveling a lot during her previous job and now works at home.  She was referred by Dr. Etter Sjogren for cardiovascular evaluation because of hypertension, atypical chest pain and palpitations.I last saw her in the office 02/07/2017.Her risk factors include only treated hypertension. She smokes an occasional cigar Glasses of wine a day as well as 2 glasses of hot tea. She has never had a heart attack or stroke. There is no family history. She gets chest pain when his times a day as well as palpitations which have been increasing in frequency and severity. She has had hypertension for last 18 months is aware source infection. She is going through perimenopause as well.I perform routine GXT on her which was entirely normal as was a 2-D echocardiogram. An event monitor showed sinus rhythm without arrhythmia. Because of side effects from her lisinopril/Hydrocort thiazide, this was changed to chlorthalidone. Her "fogginess" has resolved although her blood pressure has slowly crept up.    I obtained a routine GXT on her 01/11/2017 was which was normal.  2D echo revealed normal LV function with an atrial septal aneurysm and an event monitor was normal as well.  She is aware of salt restriction.   Since I saw her virtually 2 years ago she continues to do well.  She has changed her job and is now working from home.  She admits to dietary indiscretion.  Her lipid profile was suboptimal and her PCP did change her to high-dose rosuvastatin.  She has occasional  sharp back pain radiating to her chest which I do not think is ischemically mediated especially given her normal GXT.  She also complains of some mild dependent edema since beginning her new job which I think is multifactorial probably related to sitting at home working and dietary indiscretion. Current Meds  Medication Sig  . ALPRAZolam (XANAX) 0.5 MG tablet Take 1 tablet (0.5 mg total) by mouth daily as needed.  Marland Kitchen amLODipine (NORVASC) 5 MG tablet Take 1 tablet (5 mg total) by mouth daily.  . chlorthalidone (HYGROTON) 25 MG tablet Take 1 tablet (25 mg total) by mouth daily.  . famotidine (PEPCID) 20 MG tablet Take 1 tablet (20 mg total) by mouth 2 (two) times daily.  Marland Kitchen losartan (COZAAR) 50 MG tablet Take 1 tablet (50 mg total) by mouth daily.  . rosuvastatin (CRESTOR) 40 MG tablet Take 1 tablet (40 mg total) by mouth daily.     Allergies  Allergen Reactions  . Codeine     REACTION: itching    Social History   Socioeconomic History  . Marital status: Single    Spouse name: Not on file  . Number of children: Not on file  . Years of education: Not on file  . Highest education level: Not on file  Occupational History  . Not on file  Tobacco Use  . Smoking status: Former Smoker    Types: Cigarettes    Quit date: 11/18/2000    Years since quitting: 20.0  . Smokeless tobacco:  Never Used  Substance and Sexual Activity  . Alcohol use: Yes    Alcohol/week: 7.0 standard drinks    Types: 7 Glasses of wine per week  . Drug use: No  . Sexual activity: Not on file  Other Topics Concern  . Not on file  Social History Narrative  . Not on file   Social Determinants of Health   Financial Resource Strain: Not on file  Food Insecurity: Not on file  Transportation Needs: Not on file  Physical Activity: Not on file  Stress: Not on file  Social Connections: Not on file  Intimate Partner Violence: Not on file     Review of Systems: General: negative for chills, fever, night sweats or  weight changes.  Cardiovascular: negative for chest pain, dyspnea on exertion, edema, orthopnea, palpitations, paroxysmal nocturnal dyspnea or shortness of breath Dermatological: negative for rash Respiratory: negative for cough or wheezing Urologic: negative for hematuria Abdominal: negative for nausea, vomiting, diarrhea, bright red blood per rectum, melena, or hematemesis Neurologic: negative for visual changes, syncope, or dizziness All other systems reviewed and are otherwise negative except as noted above.    Blood pressure 122/82, pulse 75, height 5\' 4"  (1.626 m), weight 168 lb 9.6 oz (76.5 kg), last menstrual period 05/17/2015, SpO2 99 %.  General appearance: alert and no distress Neck: no adenopathy, no carotid bruit, no JVD, supple, symmetrical, trachea midline and thyroid not enlarged, symmetric, no tenderness/mass/nodules Lungs: clear to auscultation bilaterally Heart: regular rate and rhythm, S1, S2 normal, no murmur, click, rub or gallop Extremities: extremities normal, atraumatic, no cyanosis or edema Pulses: 2+ and symmetric Skin: Skin color, texture, turgor normal. No rashes or lesions Neurologic: Alert and oriented X 3, normal strength and tone. Normal symmetric reflexes. Normal coordination and gait  EKG sinus rhythm at 74 with nonspecific ST and T wave changes.  I personally reviewed this EKG.  ASSESSMENT AND PLAN:   Essential hypertension History of essential hypertension a blood pressure measured today at 122/82.  She is on amlodipine and chlorthalidone as well as losartan.  Palpitations History of palpitations in the past with event monitor performed 12/20/2016 which was entirely normal without arrhythmias.  Chest pain History of atypical back and chest pain in the past with a negative GXT.  This was performed on 01/03/2017.  Hyperlipidemia History of hyperlipidemia on statin therapy which was recently changed by her PCP to high-dose rosuvastatin.  Lipid profile  performed 11/15/2020 revealed total cholesterol of 186, LDL 115 and HDL 47.      Lorretta Harp MD [facility], [facility] 12/03/2020 8:45 AM

## 2020-12-03 NOTE — Assessment & Plan Note (Signed)
History of essential hypertension a blood pressure measured today at 122/82.  She is on amlodipine and chlorthalidone as well as losartan.

## 2020-12-03 NOTE — Assessment & Plan Note (Signed)
History of atypical back and chest pain in the past with a negative GXT.  This was performed on 01/03/2017.

## 2021-01-25 ENCOUNTER — Other Ambulatory Visit: Payer: Self-pay | Admitting: Family Medicine

## 2021-01-25 DIAGNOSIS — I10 Essential (primary) hypertension: Secondary | ICD-10-CM

## 2021-01-28 ENCOUNTER — Other Ambulatory Visit: Payer: Self-pay | Admitting: Family Medicine

## 2021-01-28 DIAGNOSIS — R1013 Epigastric pain: Secondary | ICD-10-CM

## 2021-01-28 DIAGNOSIS — E785 Hyperlipidemia, unspecified: Secondary | ICD-10-CM

## 2021-04-24 ENCOUNTER — Other Ambulatory Visit: Payer: Self-pay | Admitting: Family Medicine

## 2021-04-24 DIAGNOSIS — I10 Essential (primary) hypertension: Secondary | ICD-10-CM

## 2021-05-22 ENCOUNTER — Other Ambulatory Visit: Payer: Self-pay | Admitting: Family Medicine

## 2021-05-22 DIAGNOSIS — I10 Essential (primary) hypertension: Secondary | ICD-10-CM

## 2021-07-30 ENCOUNTER — Other Ambulatory Visit: Payer: Self-pay | Admitting: Family Medicine

## 2021-07-30 DIAGNOSIS — I10 Essential (primary) hypertension: Secondary | ICD-10-CM

## 2021-08-11 ENCOUNTER — Ambulatory Visit (HOSPITAL_BASED_OUTPATIENT_CLINIC_OR_DEPARTMENT_OTHER)
Admission: RE | Admit: 2021-08-11 | Discharge: 2021-08-11 | Disposition: A | Discharge status: Home / Self Care | Payer: Managed Care, Other (non HMO) | Source: Ambulatory Visit | Attending: Family Medicine | Admitting: Family Medicine

## 2021-08-11 ENCOUNTER — Encounter: Payer: Self-pay | Admitting: Family Medicine

## 2021-08-11 ENCOUNTER — Other Ambulatory Visit: Payer: Self-pay

## 2021-08-11 ENCOUNTER — Ambulatory Visit (INDEPENDENT_AMBULATORY_CARE_PROVIDER_SITE_OTHER): Payer: Managed Care, Other (non HMO) | Admitting: Family Medicine

## 2021-08-11 VITALS — BP 140/98 | HR 72 | Temp 97.6°F | Resp 18 | Ht 64.0 in | Wt 159.8 lb

## 2021-08-11 DIAGNOSIS — M545 Low back pain, unspecified: Secondary | ICD-10-CM

## 2021-08-11 MED ORDER — KETOROLAC TROMETHAMINE 60 MG/2ML IM SOLN
60.0000 mg | Freq: Once | INTRAMUSCULAR | Status: AC
  Administered 2021-08-11: 60 mg via INTRAMUSCULAR

## 2021-08-11 MED ORDER — CYCLOBENZAPRINE HCL 10 MG PO TABS
10.0000 mg | ORAL_TABLET | Freq: Three times a day (TID) | ORAL | 0 refills | Status: DC | PRN
Start: 2021-08-11 — End: 2022-10-09

## 2021-08-11 MED ORDER — PREDNISONE 10 MG PO TABS: ORAL_TABLET | ORAL | 0 refills | Status: DC

## 2021-08-11 NOTE — Progress Notes (Addendum)
Established Patient Office Visit  Subjective:  Patient ID: Jean Blair, female    DOB: [birth year]  Age: 61 y.o. MRN: [id number]  CC:  Chief Complaint  Patient presents with   Back Pain    Pt states no fall or injury. Pt states having spasms and have gotten worse since Sunday. Pt states pain between her shoulder comes and goes.    Shoulder Pain    HPI Jean Blair presents for low back pain radiating down R leg to thigh.  No known injury but she did sleep in a chair in a hosp room when her mom was in the hospital.  She has been using lidocaine rub and massage with heat      Past Medical History:  Diagnosis Date   Adenomatous polyps    Anemia    Anxiety    Cataract    forming    Hyperlipidemia    Hypertension     Past Surgical History:  Procedure Laterality Date   COLONOSCOPY     POLYPECTOMY     TUBAL LIGATION  1991   UPPER GASTROINTESTINAL ENDOSCOPY      Family History  Problem Relation Age of Onset   Diabetes Mother    Anxiety disorder Other    Diabetes Other    Hypertension Other    Mental illness Other    Alcohol abuse Other    Stroke Other    Heart disease Maternal Grandfather    Heart attack Maternal Grandfather    Cancer Brother        bladder   Bladder Cancer Brother    Colon cancer Neg Hx    Colon polyps Neg Hx    Esophageal cancer Neg Hx    Rectal cancer Neg Hx    Stomach cancer Neg Hx     Social History   Socioeconomic History   Marital status: Single    Spouse name: Not on file   Number of children: Not on file   Years of education: Not on file   Highest education level: Not on file  Occupational History   Not on file  Tobacco Use   Smoking status: Former    Types: Cigarettes    Quit date: 11/18/2000    Years since quitting: 20.7   Smokeless tobacco: Never  Substance and Sexual Activity   Alcohol use: Yes    Alcohol/week: 7.0 standard drinks    Types: 7 Glasses of wine per week   Drug use: No   Sexual activity: Not on  file  Other Topics Concern   Not on file  Social History Narrative   Not on file   Social Determinants of Health   Financial Resource Strain: Not on file  Food Insecurity: Not on file  Transportation Needs: Not on file  Physical Activity: Not on file  Stress: Not on file  Social Connections: Not on file  Intimate Partner Violence: Not on file    Outpatient Medications Prior to Visit  Medication Sig Dispense Refill   ALPRAZolam (XANAX) 0.5 MG tablet Take 1 tablet (0.5 mg total) by mouth daily as needed. 30 tablet 2   amLODipine (NORVASC) 5 MG tablet TAKE 1 TABLET (5 MG TOTAL) BY MOUTH DAILY. 90 tablet 1   chlorthalidone (HYGROTON) 25 MG tablet TAKE 1 TABLET BY MOUTH EVERY DAY 90 tablet 1   famotidine (PEPCID) 20 MG tablet TAKE 1 TABLET BY MOUTH TWICE A DAY 180 tablet 1   losartan (COZAAR) 50 MG tablet TAKE  1 TABLET BY MOUTH EVERY DAY 30 tablet 0   rosuvastatin (CRESTOR) 40 MG tablet TAKE 1 TABLET BY MOUTH EVERY DAY 90 tablet 2   No facility-administered medications prior to visit.    Allergies  Allergen Reactions   Codeine     REACTION: itching    ROS Review of Systems  Constitutional:  Negative for activity change, appetite change, fatigue and unexpected weight change.  Respiratory:  Negative for cough and shortness of breath.   Cardiovascular:  Negative for chest pain and palpitations.  Musculoskeletal:  Positive for back pain.  Neurological:  Negative for weakness.  Psychiatric/Behavioral:  Negative for behavioral problems and dysphoric mood. The patient is not nervous/anxious.      Objective:    Physical Exam Vitals and nursing note reviewed.  Constitutional:      Appearance: She is well-developed.  HENT:     Head: Normocephalic and atraumatic.  Eyes:     Conjunctiva/sclera: Conjunctivae normal.  Neck:     Thyroid: No thyromegaly.     Vascular: No carotid bruit or JVD.  Cardiovascular:     Rate and Rhythm: Normal rate and regular rhythm.     Heart  sounds: Normal heart sounds. No murmur heard. Pulmonary:     Effort: Pulmonary effort is normal. No respiratory distress.     Breath sounds: Normal breath sounds. No wheezing or rales.  Chest:     Chest wall: No tenderness.  Musculoskeletal:        General: Tenderness present.     Cervical back: Normal range of motion and neck supple.  Neurological:     Mental Status: She is alert and oriented to person, place, and time.   BP (!) 140/98 (BP Location: Left Arm, Patient Position: Sitting, Cuff Size: Normal)    Pulse 72    Temp 97.6 F (36.4 C) (Oral)    Resp 18    Ht 5\' 4"  (1.626 m)    Wt 159 lb 12.8 oz (72.5 kg)    LMP 05/17/2015    SpO2 98%    BMI 27.43 kg/m  Wt Readings from Last 3 Encounters:  08/11/21 159 lb 12.8 oz (72.5 kg)  12/03/20 168 lb 9.6 oz (76.5 kg)  11/15/20 169 lb 3.2 oz (76.7 kg)     Health Maintenance Due  Topic Date Due   COVID-19 Vaccine (4 - Booster for Moderna series) 08/20/2020   INFLUENZA VACCINE  Never done   DEXA SCAN  03/06/2021    There are no preventive care reminders to display for this patient.  Lab Results  Component Value Date   TSH 1.77 11/15/2020   Lab Results  Component Value Date   WBC 10.3 11/19/2020   HGB 14.9 11/19/2020   HCT 43.0 11/19/2020   MCV 90.3 11/19/2020   PLT 402.0 (H) 11/19/2020   Lab Results  Component Value Date   NA 141 11/15/2020   K 3.7 11/15/2020   CO2 29 11/15/2020   GLUCOSE 103 (H) 11/15/2020   BUN 7 11/15/2020   CREATININE 0.73 11/15/2020   BILITOT 0.4 11/15/2020   ALKPHOS 61 11/15/2020   AST 14 11/15/2020   ALT 17 11/15/2020   PROT 6.8 11/15/2020   ALBUMIN 4.3 11/15/2020   CALCIUM 10.1 11/15/2020   ANIONGAP 6 07/15/2015   GFR 91.02 11/15/2020   Lab Results  Component Value Date   CHOL 186 11/15/2020   Lab Results  Component Value Date   HDL 47.80 11/15/2020   Lab Results  Component  Value Date   LDLCALC 115 (H) 11/15/2020   Lab Results  Component Value Date   TRIG 115.0 11/15/2020    Lab Results  Component Value Date   CHOLHDL 4 11/15/2020   No results found for: HGBA1C    Assessment & Plan:   Problem List Items Addressed This Visit       Unprioritized   Low back pain with radiation - Primary    Xray today Muscle relaxer and toradol injection  Heat/ ice rto if symptoms do not improve      Relevant Medications   cyclobenzaprine (FLEXERIL) 10 MG tablet   Other Relevant Orders   DG Lumbar Spine Complete (Completed)    Meds ordered this encounter  Medications   DISCONTD: predniSONE (DELTASONE) 10 MG tablet    Sig: TAKE 3 TABLETS PO QD FOR 3 DAYS THEN TAKE 2 TABLETS PO QD FOR 3 DAYS THEN TAKE 1 TABLET PO QD FOR 3 DAYS THEN TAKE 1/2 TAB PO QD FOR 3 DAYS    Dispense:  20 tablet    Refill:  0   cyclobenzaprine (FLEXERIL) 10 MG tablet    Sig: Take 1 tablet (10 mg total) by mouth 3 (three) times daily as needed for muscle spasms.    Dispense:  30 tablet    Refill:  0   ketorolac (TORADOL) injection 60 mg    Follow-up: Return in about 2 weeks (around 08/25/2021), or if symptoms worsen or fail to improve.    Ann Held, DO

## 2021-08-11 NOTE — Patient Instructions (Signed)
Lumbosacral Radiculopathy °Lumbosacral radiculopathy is a condition that involves the spinal nerves and nerve roots in the low back and bottom of the spine. The condition develops when these nerves and nerve roots move out of place or become inflamed and cause symptoms. °What are the causes? °This condition may be caused by: °Pressure from a disk that bulges out of place (herniated disk). A disk is a plate of soft cartilage that separates bones in the spine. °Disk changes that occur with age (disk degeneration). °A narrowing of the bones of the lower back (spinal stenosis). °A tumor. °An infection. °An injury that places sudden pressure on the disks that cushion the bones of your lower spine. °What increases the risk? °You are more likely to develop this condition if: °You are a female who is 30-50 years old. °You are a female who is 50-60 years old. °You use improper technique when lifting things. °You are overweight or live a sedentary lifestyle. °You smoke. °Your work requires frequent lifting. °You do repetitive activities that strain the spine. °What are the signs or symptoms? °Symptoms of this condition include: °Pain that goes down from your back into your legs (sciatica), usually on one side of the body. This is the most common symptom. The pain may be worse when you sit, cough, or sneeze. °Tingling and numbness in your legs. °Muscle weakness in your legs. °Loss of bladder control or bowel control. °How is this diagnosed? °This condition may be diagnosed based on: °Your symptoms and medical history. °A physical exam. °If the pain lasts, you may have tests, such as: °MRI. °X-ray. °CT scan. °A type of CT scan used to examine the spinal canal after injecting a dye into your spine (myelogram). °A test to measure how electrical impulses move through a nerve (nerve conduction study). °A test to measure the electrical activity in muscles (electromyogram). °How is this treated? °In many cases, treatment is not needed  for this condition. With rest, the condition usually gets better over time. If treatment is needed, it may include: °Working with a physical therapist to improve strength and flexibility. °Taking pain medicine. °Applying heat or ice or both to the affected areas. °Having chiropractic spinal manipulation. °Using transcutaneous electrical nerve stimulation (TENS) therapy. °Getting a steroid injection in the spine. °Having surgery. This may be needed if other treatments do not help. Different types of surgery may be done depending on the cause of this condition. °Follow these instructions at home: °Activity °Avoid bending and any other activities that make the problem worse. °Maintain a proper position when standing or sitting. °When standing, keep your upper back and neck straight with your shoulders pulled back. Avoid slouching. °When sitting, keep your back straight and relax your shoulders. Do not round your shoulders or pull them backward. °Do not sit or stand in one place for long periods of time. °Take brief periods of rest throughout the day. This will reduce your pain. It is usually better to rest by lying down or standing, not sitting. °Mix in mild activity or stretching between long periods of rest. This will help to prevent stiffness and pain. °Get regular exercise. Ask your health care provider what activities are safe for you. If you were shown how to do any exercises or stretches, do them as told by your health care provider. °You may have to avoid lifting. Ask your health care provider how much you can safely lift. °Always use proper lifting technique, which includes: °Bending your knees. °Keeping the load close   to your body. °Avoiding twisting. °Managing pain °If directed, put ice on the affected area. To do this: °Put ice in a plastic bag. °Place a towel between your skin and the bag. °Leave the ice on for 20 minutes, 2-3 times a day. °Remove the ice if your skin turns bright red. This is very  important. If you cannot feel pain, heat, or cold, you have a greater risk of damage to the area. °If directed, apply heat to the affected area as often as told by your health care provider. Use the heat source that your health care provider recommends, such as a moist heat pack or a heating pad. °Place a towel between your skin and the heat source. °Leave the heat on for 20-30 minutes. °Remove the heat if your skin turns bright red. This is especially important if you are unable to feel pain, heat, or cold. You have a greater risk of getting burned. °Take over-the-counter and prescription medicines only as told by your health care provider. °General instructions °Sleep on a firm mattress in a comfortable position. Try lying on your side with your knees slightly bent. If you lie on your back, put a pillow under your knees. °Ask your health care provider if the medicine prescribed to you requires you to avoid driving or using machinery. °If your health care provider prescribed a diet or exercise program, follow it as told. °Keep all follow-up visits. This is important. °Contact a health care provider if: °Your pain does not get better over time, even when taking pain medicines. °Get help right away if: °You develop severe pain. °Your pain suddenly gets worse. °You develop increasing weakness in your legs. °You lose the ability to control your bladder or bowel. °You have difficulty walking or balancing. °You have a fever. °Summary °Lumbosacral radiculopathy is a condition that occurs when the spinal nerves and nerve roots in the lower part of the spine move out of place or become inflamed and cause symptoms. °Symptoms include pain, numbness, and tingling that go down from your back into your legs (sciatica), muscle weakness, and loss of bladder control or bowel control. °If directed, apply ice or heat or both to the affected area as told by your health care provider. °Follow instructions about activity, rest, and  proper lifting technique. °This information is not intended to replace advice given to you by your health care provider. Make sure you discuss any questions you have with your health care provider. °Document Revised: 01/06/2021 Document Reviewed: 01/06/2021 °Elsevier Patient Education © 2022 [employer] ° °

## 2021-08-12 LAB — HM MAMMOGRAPHY

## 2021-08-15 ENCOUNTER — Encounter: Payer: Self-pay | Admitting: Family Medicine

## 2021-08-17 DIAGNOSIS — M545 Low back pain, unspecified: Secondary | ICD-10-CM | POA: Insufficient documentation

## 2021-08-17 NOTE — Assessment & Plan Note (Signed)
Xray today Muscle relaxer and toradol injection  Heat/ ice rto if symptoms do not improve

## 2021-08-27 ENCOUNTER — Other Ambulatory Visit: Payer: Self-pay | Admitting: Family Medicine

## 2021-08-27 DIAGNOSIS — I10 Essential (primary) hypertension: Secondary | ICD-10-CM

## 2021-09-02 ENCOUNTER — Ambulatory Visit: Payer: Managed Care, Other (non HMO) | Admitting: Family Medicine

## 2021-09-02 ENCOUNTER — Encounter: Payer: Self-pay | Admitting: Family Medicine

## 2021-09-02 VITALS — BP 134/88 | HR 84 | Temp 98.4°F | Resp 18 | Ht 64.0 in | Wt 163.2 lb

## 2021-09-02 DIAGNOSIS — M47816 Spondylosis without myelopathy or radiculopathy, lumbar region: Secondary | ICD-10-CM | POA: Diagnosis not present

## 2021-09-02 DIAGNOSIS — E785 Hyperlipidemia, unspecified: Secondary | ICD-10-CM

## 2021-09-02 DIAGNOSIS — E663 Overweight: Secondary | ICD-10-CM

## 2021-09-02 DIAGNOSIS — M545 Low back pain, unspecified: Secondary | ICD-10-CM

## 2021-09-02 DIAGNOSIS — I10 Essential (primary) hypertension: Secondary | ICD-10-CM | POA: Diagnosis not present

## 2021-09-02 MED ORDER — WEGOVY 0.5 MG/0.5ML ~~LOC~~ SOAJ
0.5000 mg | SUBCUTANEOUS | 2 refills | Status: DC
  Filled 2022-03-02: qty 2, 28d supply, fill #0

## 2021-09-02 NOTE — Assessment & Plan Note (Signed)
Pt working on diet and exercise rx wegovy and f/u 3 months or sooner prn

## 2021-09-02 NOTE — Assessment & Plan Note (Signed)
Improved  Refer to PT

## 2021-09-02 NOTE — Assessment & Plan Note (Signed)
Well controlled, no changes to meds. Encouraged heart healthy diet such as the DASH diet and exercise as tolerated.  °

## 2021-09-02 NOTE — Assessment & Plan Note (Signed)
Encourage heart healthy diet such as MIND or DASH diet, increase exercise, avoid trans fats, simple carbohydrates and processed foods, consider a krill or fish or flaxseed oil cap daily.  °

## 2021-09-02 NOTE — Progress Notes (Signed)
Subjective:   By signing my name below, I, Zite Okoli, attest that this documentation has been prepared under the direction and in the presence of Ann Held, DO. 09/02/2021    Patient ID: Jean Blair, female    DOB: 06-12-1961, 61 y.o.   MRN: [id number]  Chief Complaint  Patient presents with   Back Pain    Pt states back pain is feeling better.    Follow-up    HPI Patient is in today for an office visit and 2 week f/u.  She reports that the pain has improved greatly since the injection and  10 mg flexeril. The back pain is still present but is very manageable now. Her x-ray showed signs of arthritis so she will like to start physical therapy.  Her blood pressure is elevated at this visit but better compared to the last visit. BP Readings from Last 3 Encounters:  09/02/21 134/88  08/11/21 (!) 140/98  12/03/20 122/82    She is interested in starting weight loss medications since she has been gaining weight.   Past Medical History:  Diagnosis Date   Adenomatous polyps    Anemia    Anxiety    Cataract    forming    Hyperlipidemia    Hypertension     Past Surgical History:  Procedure Laterality Date   COLONOSCOPY     POLYPECTOMY     TUBAL LIGATION  1991   UPPER GASTROINTESTINAL ENDOSCOPY      Family History  Problem Relation Age of Onset   Diabetes Mother    Anxiety disorder Other    Diabetes Other    Hypertension Other    Mental illness Other    Alcohol abuse Other    Stroke Other    Heart disease Maternal Grandfather    Heart attack Maternal Grandfather    Cancer Brother        bladder   Bladder Cancer Brother    Colon cancer Neg Hx    Colon polyps Neg Hx    Esophageal cancer Neg Hx    Rectal cancer Neg Hx    Stomach cancer Neg Hx     Social History   Socioeconomic History   Marital status: Single    Spouse name: Not on file   Number of children: Not on file   Years of education: Not on file   Highest education level: Not on  file  Occupational History   Not on file  Tobacco Use   Smoking status: Former    Types: Cigarettes    Quit date: 11/18/2000    Years since quitting: 20.8   Smokeless tobacco: Never  Substance and Sexual Activity   Alcohol use: Yes    Alcohol/week: 7.0 standard drinks    Types: 7 Glasses of wine per week   Drug use: No   Sexual activity: Not on file  Other Topics Concern   Not on file  Social History Narrative   Not on file   Social Determinants of Health   Financial Resource Strain: Not on file  Food Insecurity: Not on file  Transportation Needs: Not on file  Physical Activity: Not on file  Stress: Not on file  Social Connections: Not on file  Intimate Partner Violence: Not on file    Outpatient Medications Prior to Visit  Medication Sig Dispense Refill   ALPRAZolam (XANAX) 0.5 MG tablet Take 1 tablet (0.5 mg total) by mouth daily as needed. 30 tablet 2  amLODipine (NORVASC) 5 MG tablet TAKE 1 TABLET (5 MG TOTAL) BY MOUTH DAILY. 90 tablet 1   chlorthalidone (HYGROTON) 25 MG tablet TAKE 1 TABLET BY MOUTH EVERY DAY 90 tablet 1   cyclobenzaprine (FLEXERIL) 10 MG tablet Take 1 tablet (10 mg total) by mouth 3 (three) times daily as needed for muscle spasms. 30 tablet 0   famotidine (PEPCID) 20 MG tablet TAKE 1 TABLET BY MOUTH TWICE A DAY 180 tablet 1   losartan (COZAAR) 50 MG tablet TAKE 1 TABLET BY MOUTH EVERY DAY 90 tablet 0   rosuvastatin (CRESTOR) 40 MG tablet TAKE 1 TABLET BY MOUTH EVERY DAY 90 tablet 2   No facility-administered medications prior to visit.    Allergies  Allergen Reactions   Codeine     REACTION: itching    Review of Systems  Constitutional:  Negative for fever.  HENT:  Negative for congestion, ear pain, hearing loss, sinus pain and sore throat.   Eyes:  Negative for blurred vision and pain.  Respiratory:  Negative for cough, sputum production, shortness of breath and wheezing.   Cardiovascular:  Negative for chest pain and palpitations.   Gastrointestinal:  Negative for blood in stool, constipation, diarrhea, nausea and vomiting.  Genitourinary:  Negative for dysuria, frequency, hematuria and urgency.  Musculoskeletal:  Positive for back pain. Negative for falls and myalgias.  Neurological:  Negative for dizziness, sensory change, loss of consciousness, weakness and headaches.  Endo/Heme/Allergies:  Negative for environmental allergies. Does not bruise/bleed easily.  Psychiatric/Behavioral:  Negative for depression and suicidal ideas. The patient is not nervous/anxious and does not have insomnia.       Objective:    Physical Exam Constitutional:      General: She is not in acute distress.    Appearance: Normal appearance. She is not ill-appearing.  HENT:     Head: Normocephalic and atraumatic.     Right Ear: External ear normal.     Left Ear: External ear normal.  Eyes:     Extraocular Movements: Extraocular movements intact.     Pupils: Pupils are equal, round, and reactive to light.  Cardiovascular:     Rate and Rhythm: Normal rate and regular rhythm.     Pulses: Normal pulses.     Heart sounds: Normal heart sounds. No murmur heard.   No gallop.  Pulmonary:     Effort: Pulmonary effort is normal. No respiratory distress.     Breath sounds: Normal breath sounds. No wheezing, rhonchi or rales.  Abdominal:     General: Bowel sounds are normal. There is no distension.     Palpations: Abdomen is soft. There is no mass.     Tenderness: There is no abdominal tenderness. There is no guarding or rebound.     Hernia: No hernia is present.  Musculoskeletal:     Cervical back: Normal range of motion and neck supple.  Lymphadenopathy:     Cervical: No cervical adenopathy.  Skin:    General: Skin is warm and dry.  Neurological:     Mental Status: She is alert and oriented to person, place, and time.  Psychiatric:        Behavior: Behavior normal.    BP 134/88 (BP Location: Left Arm, Patient Position: Sitting, Cuff  Size: Normal)    Pulse 84    Temp 98.4 F (36.9 C) (Oral)    Resp 18    Ht 5\' 4"  (1.626 m)    Wt 163 lb 3.2 oz (74 kg)  LMP 05/17/2015    SpO2 99%    BMI 28.01 kg/m  Wt Readings from Last 3 Encounters:  09/02/21 163 lb 3.2 oz (74 kg)  08/11/21 159 lb 12.8 oz (72.5 kg)  12/03/20 168 lb 9.6 oz (76.5 kg)    Diabetic Foot Exam - Simple   No data filed    Lab Results  Component Value Date   WBC 10.3 11/19/2020   HGB 14.9 11/19/2020   HCT 43.0 11/19/2020   PLT 402.0 (H) 11/19/2020   GLUCOSE 103 (H) 11/15/2020   CHOL 186 11/15/2020   TRIG 115.0 11/15/2020   HDL 47.80 11/15/2020   LDLDIRECT 208.0 08/27/2015   LDLCALC 115 (H) 11/15/2020   ALT 17 11/15/2020   AST 14 11/15/2020   NA 141 11/15/2020   K 3.7 11/15/2020   CL 102 11/15/2020   CREATININE 0.73 11/15/2020   BUN 7 11/15/2020   CO2 29 11/15/2020   TSH 1.77 11/15/2020    Lab Results  Component Value Date   TSH 1.77 11/15/2020   Lab Results  Component Value Date   WBC 10.3 11/19/2020   HGB 14.9 11/19/2020   HCT 43.0 11/19/2020   MCV 90.3 11/19/2020   PLT 402.0 (H) 11/19/2020   Lab Results  Component Value Date   NA 141 11/15/2020   K 3.7 11/15/2020   CO2 29 11/15/2020   GLUCOSE 103 (H) 11/15/2020   BUN 7 11/15/2020   CREATININE 0.73 11/15/2020   BILITOT 0.4 11/15/2020   ALKPHOS 61 11/15/2020   AST 14 11/15/2020   ALT 17 11/15/2020   PROT 6.8 11/15/2020   ALBUMIN 4.3 11/15/2020   CALCIUM 10.1 11/15/2020   ANIONGAP 6 07/15/2015   GFR 91.02 11/15/2020   Lab Results  Component Value Date   CHOL 186 11/15/2020   Lab Results  Component Value Date   HDL 47.80 11/15/2020   Lab Results  Component Value Date   LDLCALC 115 (H) 11/15/2020   Lab Results  Component Value Date   TRIG 115.0 11/15/2020   Lab Results  Component Value Date   CHOLHDL 4 11/15/2020   No results found for: HGBA1C     Assessment & Plan:   Problem List Items Addressed This Visit       Unprioritized   Essential  hypertension    Well controlled, no changes to meds. Encouraged heart healthy diet such as the DASH diet and exercise as tolerated.       Hyperlipidemia    Encourage heart healthy diet such as MIND or DASH diet, increase exercise, avoid trans fats, simple carbohydrates and processed foods, consider a krill or fish or flaxseed oil cap daily.       Low back pain with radiation    Improved  Refer to PT      Overweight (BMI 25.0-29.9)    Pt working on diet and exercise rx wegovy and f/u 3 months or sooner prn       Other Visit Diagnoses     Spondylosis of lumbar region without myelopathy or radiculopathy    -  Primary   Relevant Orders   Ambulatory referral to Physical Therapy   Morbid obesity ([place])       Relevant Medications   Semaglutide-Weight Management (WEGOVY) 0.5 MG/0.5ML SOAJ       Meds ordered this encounter  Medications   Semaglutide-Weight Management (WEGOVY) 0.5 MG/0.5ML SOAJ    Sig: Inject 0.5 mg into the skin once a week.    Dispense:  2 mL    Refill:  2    I,Zite Okoli,acting as a scribe for [employer], DO.,have documented all relevant documentation on the behalf of Ann Held, DO,as directed by  Ann Held, DO while in the presence of [employer], DO. , personally preformed the services described in this documentation.  All medical record entries made by the scribe were at my direction and in my presence.  I have reviewed the chart and discharge instructions (if applicable) and agree that the record reflects my personal performance and is accurate and complete. 09/02/2021

## 2021-09-02 NOTE — Patient Instructions (Signed)

## 2021-09-20 ENCOUNTER — Ambulatory Visit: Payer: Managed Care, Other (non HMO) | Admitting: Physical Therapy

## 2021-09-23 ENCOUNTER — Ambulatory Visit: Payer: Managed Care, Other (non HMO) | Admitting: Physical Therapy

## 2021-09-28 ENCOUNTER — Encounter: Payer: Managed Care, Other (non HMO) | Admitting: Physical Therapy

## 2021-10-03 ENCOUNTER — Encounter: Payer: Managed Care, Other (non HMO) | Admitting: Physical Therapy

## 2021-10-04 ENCOUNTER — Encounter: Payer: Self-pay | Admitting: Family Medicine

## 2021-10-04 ENCOUNTER — Ambulatory Visit: Payer: Managed Care, Other (non HMO) | Admitting: Family Medicine

## 2021-10-04 VITALS — BP 130/88 | HR 70 | Temp 99.2°F | Resp 18 | Ht 64.0 in | Wt 158.8 lb

## 2021-10-04 DIAGNOSIS — R051 Acute cough: Secondary | ICD-10-CM

## 2021-10-04 DIAGNOSIS — J0140 Acute pansinusitis, unspecified: Secondary | ICD-10-CM

## 2021-10-04 DIAGNOSIS — U071 COVID-19: Secondary | ICD-10-CM

## 2021-10-04 LAB — POC COVID19 BINAXNOW: SARS Coronavirus 2 Ag: POSITIVE — AB

## 2021-10-04 MED ORDER — PROMETHAZINE-DM 6.25-15 MG/5ML PO SYRP
5.0000 mL | ORAL_SOLUTION | Freq: Four times a day (QID) | ORAL | 0 refills | Status: DC | PRN
Start: 2021-10-04 — End: 2022-10-09

## 2021-10-04 MED ORDER — FLUTICASONE PROPIONATE 50 MCG/ACT NA SUSP: 2.0000 | Freq: Every day | NASAL | 6 refills | Status: DC

## 2021-10-04 MED ORDER — MOLNUPIRAVIR EUA 200MG CAPSULE: 4.0000 | ORAL_CAPSULE | Freq: Two times a day (BID) | ORAL | 0 refills | Status: AC

## 2021-10-04 MED ORDER — AMOXICILLIN-POT CLAVULANATE 875-125 MG PO TABS: 1.0000 | ORAL_TABLET | Freq: Two times a day (BID) | ORAL | 0 refills | Status: DC

## 2021-10-04 NOTE — Patient Instructions (Addendum)
COVID-19: Quarantine and Isolation ?Quarantine ?If you were exposed ?Quarantine and stay away from others when you have been in close contact with someone who has COVID-19. ?Isolate ?If you are sick or test positive ?Isolate when you are sick or when you have COVID-19, even if you don't have symptoms. ?When to stay home ?Calculating quarantine ?The date of your exposure is considered day 0. Day 1 is the first full day after your last contact with a person who has had COVID-19. Stay home and away from other people for at least 5 days. Learn why CDC updated guidance for the general public. ?IF YOU were exposed to COVID-19 and are NOT  ?up to dateIF YOU were exposed to COVID-19 and are NOT on COVID-19 vaccinations ?Quarantine for at least 5 days ?Stay home ?Stay home and quarantine for at least 5 full days. ?Wear a well-fitting mask if you must be around others in your home. ?Do not travel. ?Get tested ?Even if you don't develop symptoms, get tested at least 5 days after you last had close contact with someone with COVID-19. ?After quarantine ?Watch for symptoms ?Watch for symptoms until 10 days after you last had close contact with someone with COVID-19. ?Avoid travel ?It is best to avoid travel until a full 10 days after you last had close contact with someone with COVID-19. ?If you develop symptoms ?Isolate immediately and get tested. Continue to stay home until you know the results. Wear a well-fitting mask around others. ?Take precautions until day 10 ?Wear a well-fitting mask ?Wear a well-fitting mask for 10 full days any time you are around others inside your home or in public. Do not go to places where you are unable to wear a well-fitting mask. ?If you must travel during days 6-10, take precautions. ?Avoid being around people who are more likely to get very sick from COVID-19. ?IF YOU were exposed to COVID-19 and are  ?up to dateIF YOU were exposed to COVID-19 and are on COVID-19 vaccinations ?No  quarantine ?You do not need to stay home unless you develop symptoms. ?Get tested ?Even if you don't develop symptoms, get tested at least 5 days after you last had close contact with someone with COVID-19. ?Watch for symptoms ?Watch for symptoms until 10 days after you last had close contact with someone with COVID-19. ?If you develop symptoms ?Isolate immediately and get tested. Continue to stay home until you know the results. Wear a well-fitting mask around others. ?Take precautions until day 10 ?Wear a well-fitting mask ?Wear a well-fitting mask for 10 full days any time you are around others inside your home or in public. Do not go to places where you are unable to wear a well-fitting mask. ?Take precautions if traveling ?Avoid being around people who are more likely to get very sick from COVID-19. ?IF YOU were exposed to COVID-19 and had confirmed COVID-19 within the past 90 days (you tested positive using a viral test) ?No quarantine ?You do not need to stay home unless you develop symptoms. ?Watch for symptoms ?Watch for symptoms until 10 days after you last had close contact with someone with COVID-19. ?If you develop symptoms ?Isolate immediately and get tested. Continue to stay home until you know the results. Wear a well-fitting mask around others. ?Take precautions until day 10 ?Wear a well-fitting mask ?Wear a well-fitting mask for 10 full days any time you are around others inside your home or in public. Do not go to places where you are  unable to wear a well-fitting mask. ?Take precautions if traveling ?Avoid being around people who are more likely to get very sick from COVID-19. ?Calculating isolation ?Day 0 is your first day of symptoms or a positive viral test. Day 1 is the first full day after your symptoms developed or your test specimen was collected. If you have COVID-19 or have symptoms, isolate for at least 5 days. ?IF YOU tested positive for COVID-19 or have symptoms, regardless of  vaccination status ?Stay home for at least 5 days ?Stay home for 5 days and isolate from others in your home. ?Wear a well-fitting mask if you must be around others in your home. ?Do not travel. ?Ending isolation if you had symptoms ?End isolation after 5 full days if you are fever-free for 24 hours (without the use of fever-reducing medication) and your symptoms are improving. ?Ending isolation if you did NOT have symptoms ?End isolation after at least 5 full days after your positive test. ?If you got very sick from COVID-19 or have a weakened immune system ?You should isolate for at least 10 days. Consult your doctor before ending isolation. ?Take precautions until day 10 ?Wear a well-fitting mask ?Wear a well-fitting mask for 10 full days any time you are around others inside your home or in public. Do not go to places where you are unable to wear a well-fitting mask. ?Do not travel ?Do not travel until a full 10 days after your symptoms started or the date your positive test was taken if you had no symptoms. ?Avoid being around people who are more likely to get very sick from COVID-19. ?Definitions ?Exposure ?Contact with someone infected with SARS-CoV-2, the virus that causes COVID-19, in a way that increases the likelihood of getting infected with the virus. ?Close contact ?A close contact is someone who was less than 6 feet away from an infected person (laboratory-confirmed or a clinical diagnosis) for a cumulative total of 15 minutes or more over a 24-hour period. For example, three individual 5-minute exposures for a total of 15 minutes. People who are exposed to someone with COVID-19 after they completed at least 5 days of isolation are not considered close contacts. ?Quarantine ?Jean Blair is a strategy used to prevent transmission of COVID-19 by keeping people who have been in close contact with someone with COVID-19 apart from others. ?Who does not need to quarantine? ?If you had close contact with  someone with COVID-19 and you are in one of the following groups, you do not need to quarantine. ?You are up to date with your COVID-19 vaccines. ?You had confirmed COVID-19 within the last 90 days (meaning you tested positive using a viral test). ?If you are up to date with COVID-19 vaccines, you should wear a well-fitting mask around others for 10 days from the date of your last close contact with someone with COVID-19 (the date of last close contact is considered day 0). Get tested at least 5 days after you last had close contact with someone with COVID-19. If you test positive or develop COVID-19 symptoms, isolate from other people and follow recommendations in the Isolation section below. If you tested positive for COVID-19 with a viral test within the previous 90 days and subsequently recovered and remain without COVID-19 symptoms, you do not need to quarantine or get tested after close contact. You should wear a well-fitting mask around others for 10 days from the date of your last close contact with someone with COVID-19 (the date of last  close contact is considered day 0). If you have COVID-19 symptoms, get tested and isolate from other people and follow recommendations in the Isolation section below. ?Who should quarantine? ?If you come into close contact with someone with COVID-19, you should quarantine if you are not up to date on COVID-19 vaccines. This includes people who are not vaccinated. ?What to do for quarantine ?Stay home and away from other people for at least 5 days (day 0 through day 5) after your last contact with a person who has COVID-19. The date of your exposure is considered day 0. Wear a well-fitting mask when around others at home, if possible. ?For 10 days after your last close contact with someone with COVID-19, watch for fever (100.4?F or greater), cough, shortness of breath, or other COVID-19 symptoms. ?If you develop symptoms, get tested immediately and isolate until you receive  your test results. If you test positive, follow isolation recommendations. ?If you do not develop symptoms, get tested at least 5 days after you last had close contact with someone with COVID-19. ?If you test negative, you c

## 2021-10-04 NOTE — Progress Notes (Signed)
? ?Subjective:  ? ?By signing my name below, I, Shehryar Baig, attest that this documentation has been prepared under the direction and in the presence of Dr. Roma Schanz, DO. 10/04/2021 ? ? ? Patient ID: Jean Blair, female    DOB: 09/26/1960, 61 y.o.   MRN: [id number] ? ?Chief Complaint  ?Patient presents with  ? Congestion  ?  Pt states having congestion and started last week and state loss of voice started on Saturday. Pt states throat is not sore and does not hurt to speak  ? ? ?HPI ?Patient is in today for a office visit.  ? ?She complains of hoarse voice since last Saturday, 10/01/2021. She denies having a sore throat. She reports developing congestion and rhinorrhea last Tuesday. She also reports develop a cough and clear sputum production yesterday. She is taking coricidin HBP to manage her cough symptoms. She is also gargling warm salt water 3x daily to manage her throat. She is not using Flonase but is using Vicks vapor rub but finds no change in her symptoms. She thinks she may be developing a sinus infection.  ? ? ?Past Medical History:  ?Diagnosis Date  ? Adenomatous polyps   ? Anemia   ? Anxiety   ? Cataract   ? forming   ? Hyperlipidemia   ? Hypertension   ? ? ?Past Surgical History:  ?Procedure Laterality Date  ? COLONOSCOPY    ? POLYPECTOMY    ? TUBAL LIGATION  1991  ? UPPER GASTROINTESTINAL ENDOSCOPY    ? ? ?Family History  ?Problem Relation Age of Onset  ? Diabetes Mother   ? Anxiety disorder Other   ? Diabetes Other   ? Hypertension Other   ? Mental illness Other   ? Alcohol abuse Other   ? Stroke Other   ? Heart disease Maternal Grandfather   ? Heart attack Maternal Grandfather   ? Cancer Brother   ?     bladder  ? Bladder Cancer Brother   ? Colon cancer Neg Hx   ? Colon polyps Neg Hx   ? Esophageal cancer Neg Hx   ? Rectal cancer Neg Hx   ? Stomach cancer Neg Hx   ? ? ?Social History  ? ?Socioeconomic History  ? Marital status: Single  ?  Spouse name: Not on file  ? Number of  children: Not on file  ? Years of education: Not on file  ? Highest education level: Not on file  ?Occupational History  ? Not on file  ?Tobacco Use  ? Smoking status: Former  ?  Types: Cigarettes  ?  Quit date: 11/18/2000  ?  Years since quitting: 20.8  ? Smokeless tobacco: Never  ?Substance and Sexual Activity  ? Alcohol use: Yes  ?  Alcohol/week: 7.0 standard drinks  ?  Types: 7 Glasses of wine per week  ? Drug use: No  ? Sexual activity: Not on file  ?Other Topics Concern  ? Not on file  ?Social History Narrative  ? Not on file  ? ?Social Determinants of Health  ? ?Financial Resource Strain: Not on file  ?Food Insecurity: Not on file  ?Transportation Needs: Not on file  ?Physical Activity: Not on file  ?Stress: Not on file  ?Social Connections: Not on file  ?Intimate Partner Violence: Not on file  ? ? ?Outpatient Medications Prior to Visit  ?Medication Sig Dispense Refill  ? ALPRAZolam (XANAX) 0.5 MG tablet Take 1 tablet (0.5 mg total) by mouth daily  as needed. 30 tablet 2  ? amLODipine (NORVASC) 5 MG tablet TAKE 1 TABLET (5 MG TOTAL) BY MOUTH DAILY. 90 tablet 1  ? chlorthalidone (HYGROTON) 25 MG tablet TAKE 1 TABLET BY MOUTH EVERY DAY 90 tablet 1  ? cyclobenzaprine (FLEXERIL) 10 MG tablet Take 1 tablet (10 mg total) by mouth 3 (three) times daily as needed for muscle spasms. 30 tablet 0  ? famotidine (PEPCID) 20 MG tablet TAKE 1 TABLET BY MOUTH TWICE A DAY 180 tablet 1  ? losartan (COZAAR) 50 MG tablet TAKE 1 TABLET BY MOUTH EVERY DAY 90 tablet 0  ? rosuvastatin (CRESTOR) 40 MG tablet TAKE 1 TABLET BY MOUTH EVERY DAY 90 tablet 2  ? Semaglutide-Weight Management (WEGOVY) 0.5 MG/0.5ML SOAJ Inject 0.5 mg into the skin once a week. 2 mL 2  ? ?No facility-administered medications prior to visit.  ? ? ?Allergies  ?Allergen Reactions  ? Codeine   ?  REACTION: itching  ? ? ?Review of Systems  ?Constitutional:  Negative for fever and malaise/fatigue.  ?HENT:  Positive for congestion. Negative for sore throat.   ?      (+)hoarse voice  ?Eyes:  Negative for blurred vision.  ?Respiratory:  Positive for cough and sputum production (clear). Negative for shortness of breath.   ?Cardiovascular:  Negative for chest pain, palpitations and leg swelling.  ?Gastrointestinal:  Negative for abdominal pain, blood in stool and nausea.  ?Genitourinary:  Negative for dysuria and frequency.  ?Musculoskeletal:  Negative for falls.  ?Skin:  Negative for rash.  ?Neurological:  Negative for dizziness, loss of consciousness and headaches.  ?Endo/Heme/Allergies:  Negative for environmental allergies.  ?Psychiatric/Behavioral:  Negative for depression. The patient is not nervous/anxious.   ? ?   ?Objective:  ?  ?Physical Exam ?Vitals and nursing note reviewed.  ?Constitutional:   ?   General: She is not in acute distress. ?   Appearance: Normal appearance. She is not ill-appearing.  ?HENT:  ?   Head: Normocephalic and atraumatic.  ?   Right Ear: External ear normal.  ?   Left Ear: External ear normal.  ?   Mouth/Throat:  ?   Comments: Post-nasal drip noted ?Eyes:  ?   Extraocular Movements: Extraocular movements intact.  ?   Pupils: Pupils are equal, round, and reactive to light.  ?Cardiovascular:  ?   Rate and Rhythm: Normal rate and regular rhythm.  ?   Heart sounds: Normal heart sounds. No murmur heard. ?  No gallop.  ?Pulmonary:  ?   Effort: Pulmonary effort is normal. No respiratory distress.  ?   Breath sounds: Normal breath sounds. No wheezing or rales.  ?Lymphadenopathy:  ?   Cervical: Cervical adenopathy present.  ?Skin: ?   General: Skin is warm and dry.  ?Neurological:  ?   Mental Status: She is alert and oriented to person, place, and time.  ?Psychiatric:     ?   Judgment: Judgment normal.  ? ? ?BP 130/88 (BP Location: Right Arm, Patient Position: Sitting, Cuff Size: Normal)   Pulse 70   Temp 99.2 ?F (37.3 ?C) (Oral)   Resp 18   Ht '5\' 4"'$  (1.626 m)   Wt 158 lb 12.8 oz (72 kg)   LMP 05/17/2015   SpO2 98%   BMI 27.26 kg/m?  ?Wt Readings  from Last 3 Encounters:  ?10/04/21 158 lb 12.8 oz (72 kg)  ?09/02/21 163 lb 3.2 oz (74 kg)  ?08/11/21 159 lb 12.8 oz (72.5 kg)  ? ? ?  Diabetic Foot Exam - Simple   ?No data filed ?  ? ?Lab Results  ?Component Value Date  ? WBC 10.3 11/19/2020  ? HGB 14.9 11/19/2020  ? HCT 43.0 11/19/2020  ? PLT 402.0 (H) 11/19/2020  ? GLUCOSE 103 (H) 11/15/2020  ? CHOL 186 11/15/2020  ? TRIG 115.0 11/15/2020  ? HDL 47.80 11/15/2020  ? LDLDIRECT 208.0 08/27/2015  ? LDLCALC 115 (H) 11/15/2020  ? ALT 17 11/15/2020  ? AST 14 11/15/2020  ? NA 141 11/15/2020  ? K 3.7 11/15/2020  ? CL 102 11/15/2020  ? CREATININE 0.73 11/15/2020  ? BUN 7 11/15/2020  ? CO2 29 11/15/2020  ? TSH 1.77 11/15/2020  ? ? ?Lab Results  ?Component Value Date  ? TSH 1.77 11/15/2020  ? ?Lab Results  ?Component Value Date  ? WBC 10.3 11/19/2020  ? HGB 14.9 11/19/2020  ? HCT 43.0 11/19/2020  ? MCV 90.3 11/19/2020  ? PLT 402.0 (H) 11/19/2020  ? ?Lab Results  ?Component Value Date  ? NA 141 11/15/2020  ? K 3.7 11/15/2020  ? CO2 29 11/15/2020  ? GLUCOSE 103 (H) 11/15/2020  ? BUN 7 11/15/2020  ? CREATININE 0.73 11/15/2020  ? BILITOT 0.4 11/15/2020  ? ALKPHOS 61 11/15/2020  ? AST 14 11/15/2020  ? ALT 17 11/15/2020  ? PROT 6.8 11/15/2020  ? ALBUMIN 4.3 11/15/2020  ? CALCIUM 10.1 11/15/2020  ? ANIONGAP 6 07/15/2015  ? GFR 91.02 11/15/2020  ? ?Lab Results  ?Component Value Date  ? CHOL 186 11/15/2020  ? ?Lab Results  ?Component Value Date  ? HDL 47.80 11/15/2020  ? ?Lab Results  ?Component Value Date  ? LDLCALC 115 (H) 11/15/2020  ? ?Lab Results  ?Component Value Date  ? TRIG 115.0 11/15/2020  ? ?Lab Results  ?Component Value Date  ? CHOLHDL 4 11/15/2020  ? ?No results found for: HGBA1C ? ?  Covid Positive  ?Assessment & Plan:  ? ?Problem List Items Addressed This Visit   ?None ?Visit Diagnoses   ? ? Acute non-recurrent pansinusitis    -  Primary  ? Relevant Medications  ? amoxicillin-clavulanate (AUGMENTIN) 875-125 MG tablet  ? fluticasone (FLONASE) 50 MCG/ACT nasal spray  ?  promethazine-dextromethorphan (PROMETHAZINE-DM) 6.25-15 MG/5ML syrup  ? molnupiravir EUA (LAGEVRIO) 200 mg CAPS capsule  ? Other Relevant Orders  ? [place] COVID-19  ? Acute cough      ? Relevant Medications  ? promethazine

## 2021-10-06 ENCOUNTER — Encounter: Payer: Managed Care, Other (non HMO) | Admitting: Physical Therapy

## 2021-10-10 ENCOUNTER — Encounter: Payer: Managed Care, Other (non HMO) | Admitting: Physical Therapy

## 2021-10-20 ENCOUNTER — Encounter: Payer: Managed Care, Other (non HMO) | Admitting: Physical Therapy

## 2021-10-30 ENCOUNTER — Other Ambulatory Visit: Payer: Self-pay | Admitting: Family Medicine

## 2021-10-30 DIAGNOSIS — I10 Essential (primary) hypertension: Secondary | ICD-10-CM

## 2021-10-30 DIAGNOSIS — E785 Hyperlipidemia, unspecified: Secondary | ICD-10-CM

## 2021-11-25 ENCOUNTER — Other Ambulatory Visit: Payer: Self-pay | Admitting: Family Medicine

## 2021-11-25 DIAGNOSIS — I10 Essential (primary) hypertension: Secondary | ICD-10-CM

## 2022-01-05 ENCOUNTER — Encounter: Payer: Self-pay | Admitting: Family Medicine

## 2022-01-08 ENCOUNTER — Other Ambulatory Visit: Payer: Self-pay | Admitting: Family Medicine

## 2022-01-08 DIAGNOSIS — I10 Essential (primary) hypertension: Secondary | ICD-10-CM

## 2022-02-04 ENCOUNTER — Encounter: Payer: Self-pay | Admitting: Family Medicine

## 2022-02-06 ENCOUNTER — Other Ambulatory Visit: Payer: Self-pay | Admitting: Family Medicine

## 2022-03-02 ENCOUNTER — Other Ambulatory Visit: Payer: Self-pay | Admitting: Family Medicine

## 2022-03-02 ENCOUNTER — Other Ambulatory Visit (HOSPITAL_BASED_OUTPATIENT_CLINIC_OR_DEPARTMENT_OTHER): Payer: Self-pay

## 2022-03-02 DIAGNOSIS — I10 Essential (primary) hypertension: Secondary | ICD-10-CM

## 2022-03-14 ENCOUNTER — Encounter: Payer: Self-pay | Admitting: Family Medicine

## 2022-03-17 ENCOUNTER — Other Ambulatory Visit (HOSPITAL_BASED_OUTPATIENT_CLINIC_OR_DEPARTMENT_OTHER): Payer: Self-pay

## 2022-03-17 MED ORDER — WEGOVY 1 MG/0.5ML ~~LOC~~ SOAJ
1.0000 mg | SUBCUTANEOUS | 2 refills | Status: DC
  Filled 2022-03-17 – 2022-04-04 (×2): qty 2, 28d supply, fill #0
  Filled 2022-05-06: qty 2, 28d supply, fill #1
  Filled 2022-06-04: qty 2, 28d supply, fill #2

## 2022-03-30 ENCOUNTER — Other Ambulatory Visit (HOSPITAL_BASED_OUTPATIENT_CLINIC_OR_DEPARTMENT_OTHER): Payer: Self-pay

## 2022-04-04 ENCOUNTER — Other Ambulatory Visit (HOSPITAL_BASED_OUTPATIENT_CLINIC_OR_DEPARTMENT_OTHER): Payer: Self-pay

## 2022-04-10 ENCOUNTER — Other Ambulatory Visit: Payer: Self-pay | Admitting: Family Medicine

## 2022-04-10 DIAGNOSIS — I10 Essential (primary) hypertension: Secondary | ICD-10-CM

## 2022-05-06 ENCOUNTER — Other Ambulatory Visit (HOSPITAL_BASED_OUTPATIENT_CLINIC_OR_DEPARTMENT_OTHER): Payer: Self-pay

## 2022-05-08 ENCOUNTER — Other Ambulatory Visit (HOSPITAL_BASED_OUTPATIENT_CLINIC_OR_DEPARTMENT_OTHER): Payer: Self-pay

## 2022-05-12 ENCOUNTER — Other Ambulatory Visit: Payer: Self-pay | Admitting: Family Medicine

## 2022-05-12 DIAGNOSIS — I10 Essential (primary) hypertension: Secondary | ICD-10-CM

## 2022-06-05 ENCOUNTER — Other Ambulatory Visit (HOSPITAL_BASED_OUTPATIENT_CLINIC_OR_DEPARTMENT_OTHER): Payer: Self-pay

## 2022-06-12 ENCOUNTER — Other Ambulatory Visit: Payer: Self-pay | Admitting: Family Medicine

## 2022-06-12 DIAGNOSIS — I10 Essential (primary) hypertension: Secondary | ICD-10-CM

## 2022-06-16 ENCOUNTER — Other Ambulatory Visit: Payer: Self-pay | Admitting: Family Medicine

## 2022-06-20 ENCOUNTER — Other Ambulatory Visit (HOSPITAL_BASED_OUTPATIENT_CLINIC_OR_DEPARTMENT_OTHER): Payer: Self-pay

## 2022-06-20 MED ORDER — WEGOVY 1.7 MG/0.75ML ~~LOC~~ SOAJ
1.7000 mg | SUBCUTANEOUS | 0 refills | Status: DC
  Filled 2022-06-20: qty 3, 28d supply, fill #0

## 2022-06-21 ENCOUNTER — Other Ambulatory Visit (HOSPITAL_BASED_OUTPATIENT_CLINIC_OR_DEPARTMENT_OTHER): Payer: Self-pay

## 2022-06-29 ENCOUNTER — Other Ambulatory Visit (HOSPITAL_BASED_OUTPATIENT_CLINIC_OR_DEPARTMENT_OTHER): Payer: Self-pay

## 2022-07-11 ENCOUNTER — Telehealth: Payer: Self-pay | Admitting: Family Medicine

## 2022-07-11 ENCOUNTER — Encounter: Payer: Self-pay | Admitting: Family Medicine

## 2022-07-11 ENCOUNTER — Encounter (HOSPITAL_BASED_OUTPATIENT_CLINIC_OR_DEPARTMENT_OTHER): Payer: Self-pay

## 2022-07-11 ENCOUNTER — Other Ambulatory Visit (HOSPITAL_BASED_OUTPATIENT_CLINIC_OR_DEPARTMENT_OTHER): Payer: Self-pay

## 2022-07-11 ENCOUNTER — Other Ambulatory Visit: Payer: Self-pay

## 2022-07-11 MED ORDER — WEGOVY 1 MG/0.5ML ~~LOC~~ SOAJ
1.0000 mg | SUBCUTANEOUS | 0 refills | Status: DC
  Filled 2022-07-11: qty 2, 28d supply, fill #0

## 2022-07-11 MED ORDER — WEGOVY 1.7 MG/0.75ML ~~LOC~~ SOAJ
1.7000 mg | SUBCUTANEOUS | 0 refills | Status: DC
  Filled 2022-07-11: qty 3, 28d supply, fill #0

## 2022-07-11 NOTE — Telephone Encounter (Signed)
Pt stated she does not feel like she needs to go up on wegovy dosage. She would like to stay at 1.0.    [facility] [address] [place] [address] Phone: [phone]  Fax: [phone]

## 2022-07-11 NOTE — Telephone Encounter (Signed)
FYI: Refill sent

## 2022-07-31 ENCOUNTER — Other Ambulatory Visit: Payer: Self-pay | Admitting: Family Medicine

## 2022-08-01 ENCOUNTER — Encounter: Payer: Self-pay | Admitting: Gastroenterology

## 2022-08-01 ENCOUNTER — Encounter: Payer: Self-pay | Admitting: *Deleted

## 2022-08-01 ENCOUNTER — Encounter (HOSPITAL_BASED_OUTPATIENT_CLINIC_OR_DEPARTMENT_OTHER): Payer: Self-pay

## 2022-08-01 ENCOUNTER — Other Ambulatory Visit (HOSPITAL_BASED_OUTPATIENT_CLINIC_OR_DEPARTMENT_OTHER): Payer: Self-pay

## 2022-08-01 MED ORDER — WEGOVY 1 MG/0.5ML ~~LOC~~ SOAJ
1.0000 mg | SUBCUTANEOUS | 0 refills | Status: DC
  Filled 2022-08-01: qty 2, 28d supply, fill #0

## 2022-08-05 ENCOUNTER — Other Ambulatory Visit (HOSPITAL_BASED_OUTPATIENT_CLINIC_OR_DEPARTMENT_OTHER): Payer: Self-pay

## 2022-08-14 ENCOUNTER — Encounter: Payer: Self-pay | Admitting: Gastroenterology

## 2022-08-15 ENCOUNTER — Other Ambulatory Visit: Payer: Self-pay | Admitting: Family Medicine

## 2022-08-15 DIAGNOSIS — E785 Hyperlipidemia, unspecified: Secondary | ICD-10-CM

## 2022-08-15 LAB — HM MAMMOGRAPHY

## 2022-08-20 IMAGING — CR DG LUMBAR SPINE COMPLETE 4+V
5 series · 5 of 5 positions shown · non-contrast
Comparison: None.

CLINICAL DATA: Low back pain.

EXAM:
LUMBAR SPINE - COMPLETE 4+ VIEW

[t l-spine a.p.]
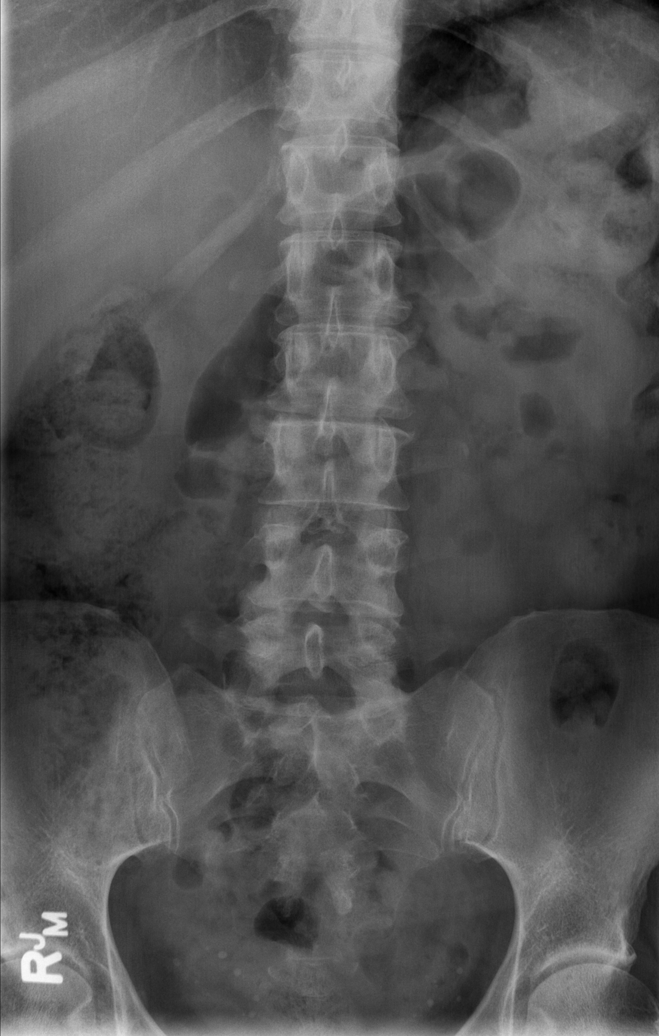

[t l-spine oblique exposure (1 of 2)]
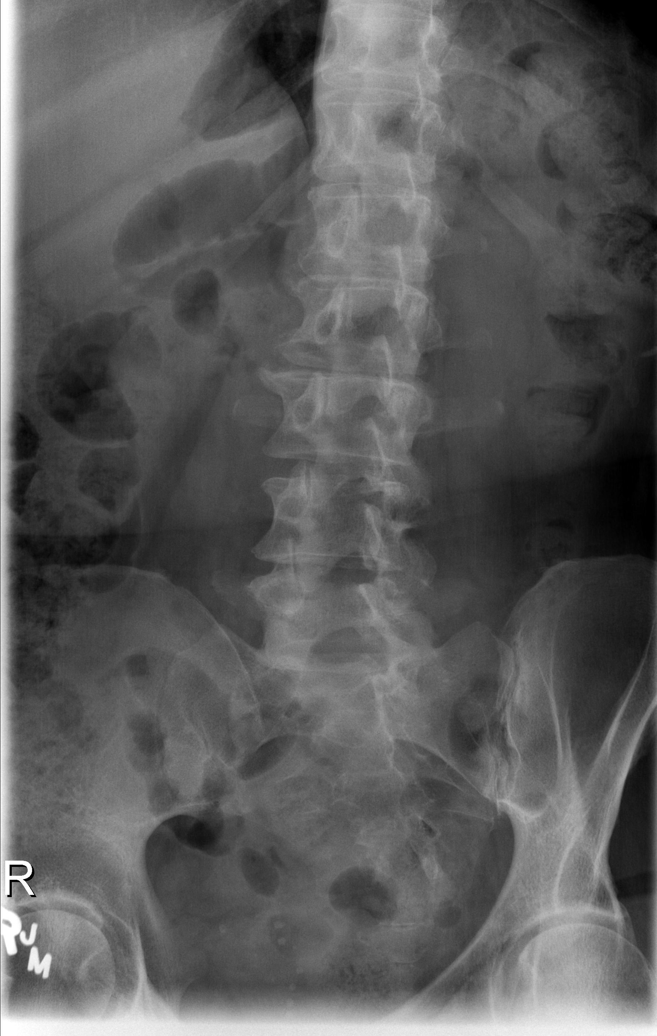

[t l-spine oblique exposure (2 of 2)]
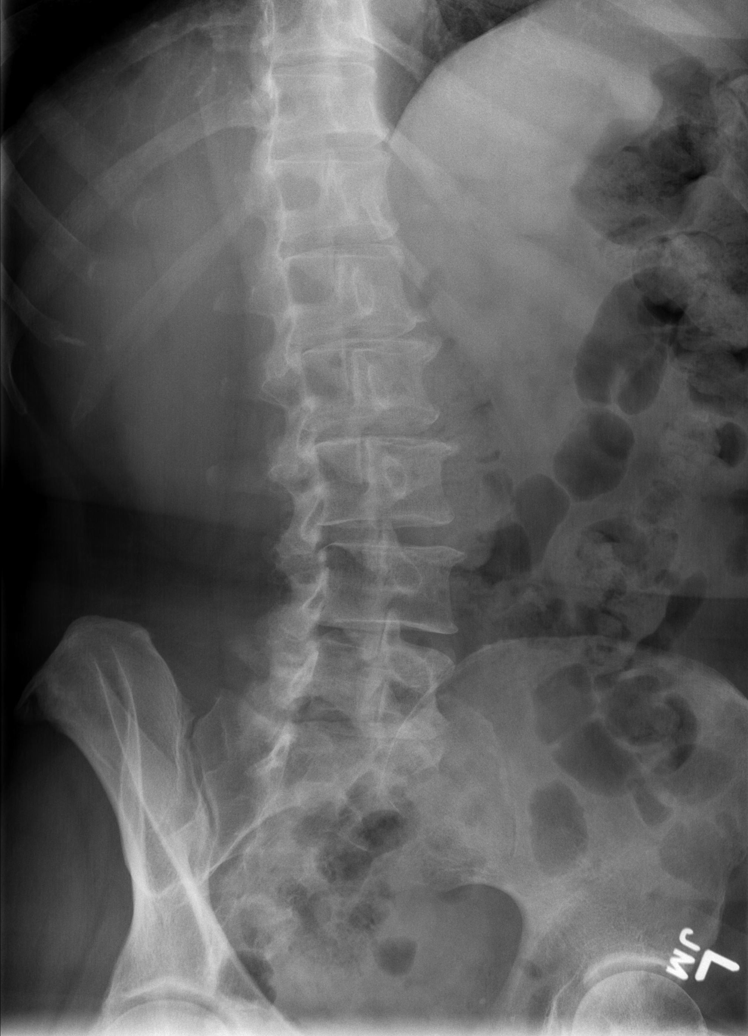

[t l-spine lat]
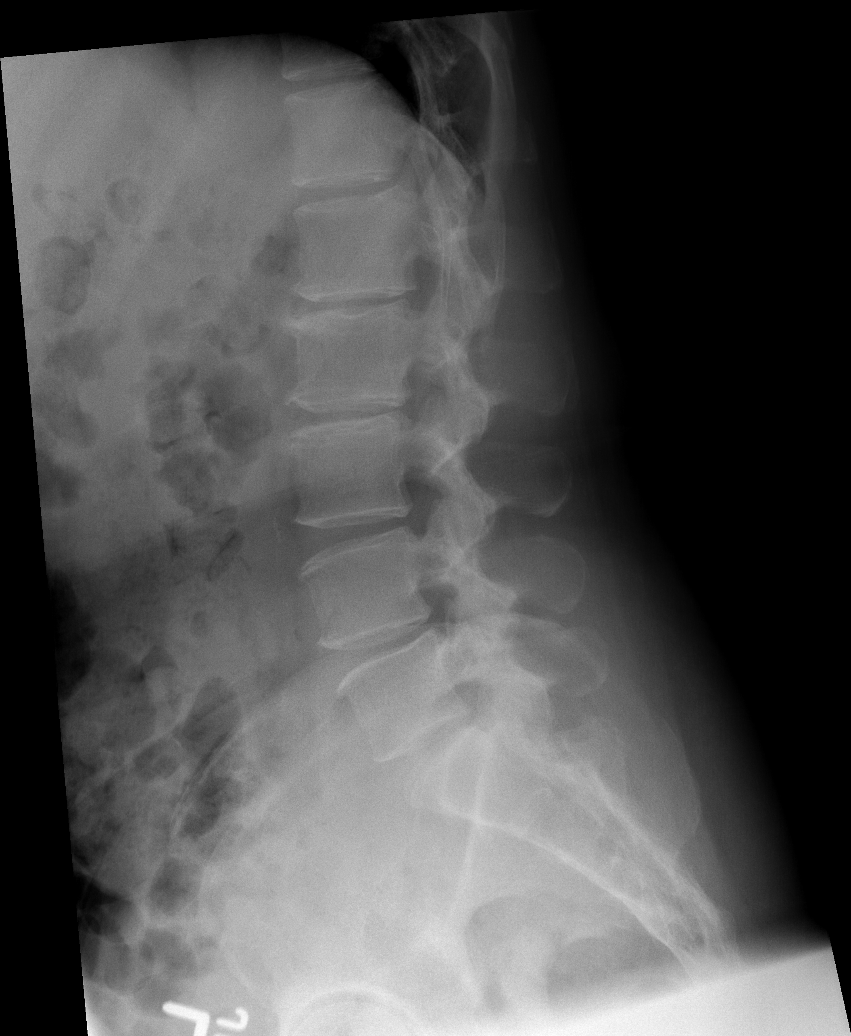

[t l-spine l5-s1 spot]
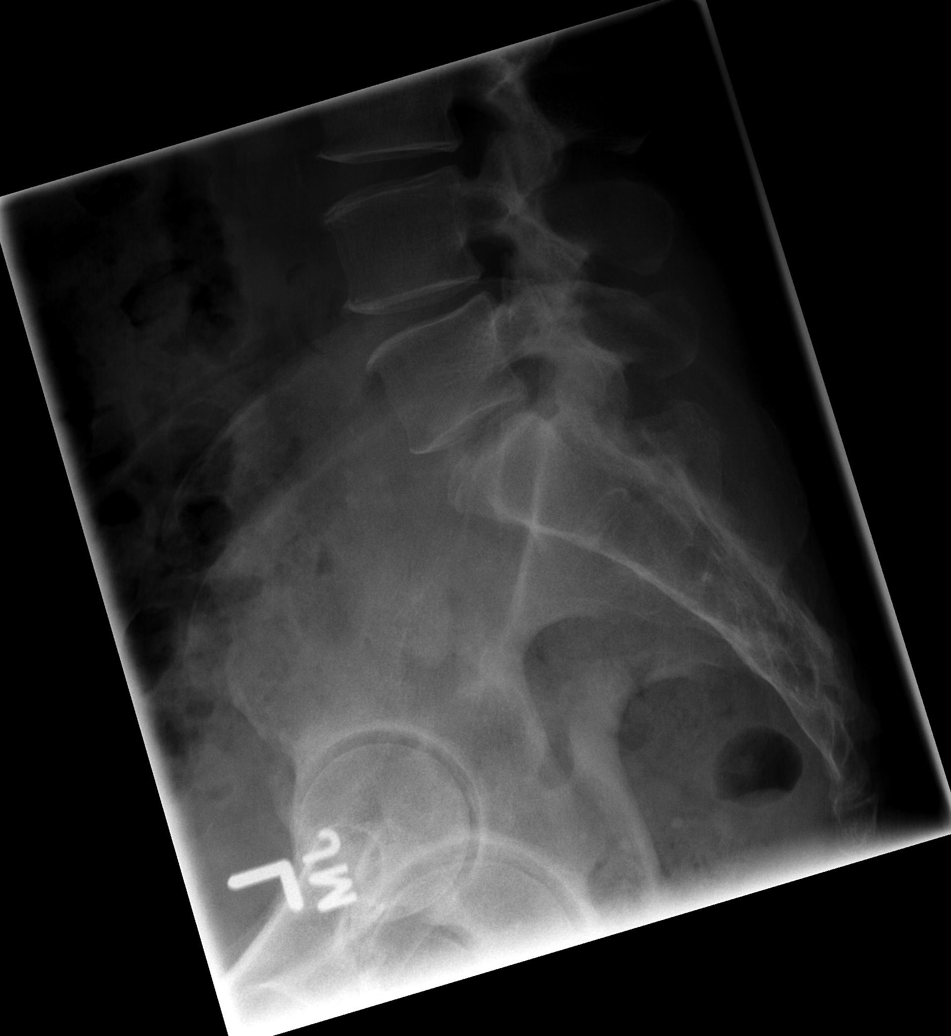

[5 of 5 positions shown; findings below may reference images not displayed]

FINDINGS: Spinal alignment is normal. There is no fracture. There is mild disc
space narrowing and endplate osteophyte formation at L1-L2 and L2-L3
compatible with degenerative change. Disc spaces are otherwise well
maintained. Soft tissues are within normal limits.
IMPRESSION: 1. No acute bony abnormality.
2. Mild degenerative changes as above.

## 2022-08-21 ENCOUNTER — Other Ambulatory Visit: Payer: Self-pay | Admitting: Family Medicine

## 2022-08-22 ENCOUNTER — Other Ambulatory Visit (HOSPITAL_BASED_OUTPATIENT_CLINIC_OR_DEPARTMENT_OTHER): Payer: Self-pay

## 2022-08-22 ENCOUNTER — Other Ambulatory Visit: Payer: Self-pay | Admitting: Family Medicine

## 2022-08-22 DIAGNOSIS — I10 Essential (primary) hypertension: Secondary | ICD-10-CM

## 2022-08-22 MED ORDER — WEGOVY 1 MG/0.5ML ~~LOC~~ SOAJ
1.0000 mg | SUBCUTANEOUS | 0 refills | Status: DC
  Filled 2022-08-22: qty 2, 28d supply, fill #0

## 2022-09-14 ENCOUNTER — Other Ambulatory Visit: Payer: Self-pay | Admitting: Family Medicine

## 2022-09-14 DIAGNOSIS — I10 Essential (primary) hypertension: Secondary | ICD-10-CM

## 2022-09-20 ENCOUNTER — Other Ambulatory Visit: Payer: Self-pay | Admitting: Family Medicine

## 2022-09-21 ENCOUNTER — Other Ambulatory Visit (HOSPITAL_BASED_OUTPATIENT_CLINIC_OR_DEPARTMENT_OTHER): Payer: Self-pay

## 2022-09-21 MED ORDER — WEGOVY 1 MG/0.5ML ~~LOC~~ SOAJ
1.0000 mg | SUBCUTANEOUS | 1 refills | Status: DC
  Filled 2022-09-21: qty 2, 28d supply, fill #0
  Filled 2022-10-19: qty 2, 28d supply, fill #1

## 2022-10-09 ENCOUNTER — Ambulatory Visit (AMBULATORY_SURGERY_CENTER): Payer: Managed Care, Other (non HMO) | Admitting: *Deleted

## 2022-10-09 ENCOUNTER — Encounter: Payer: Self-pay | Admitting: Gastroenterology

## 2022-10-09 VITALS — Ht 64.0 in | Wt 135.0 lb

## 2022-10-09 DIAGNOSIS — Z86010 Personal history of colonic polyps: Secondary | ICD-10-CM

## 2022-10-09 MED ORDER — NA SULFATE-K SULFATE-MG SULF 17.5-3.13-1.6 GM/177ML PO SOLN: 1.0000 | Freq: Once | ORAL | 0 refills | Status: AC

## 2022-10-09 NOTE — Progress Notes (Signed)

## 2022-11-09 ENCOUNTER — Encounter: Payer: Self-pay | Admitting: Certified Registered Nurse Anesthetist

## 2022-11-10 ENCOUNTER — Ambulatory Visit (AMBULATORY_SURGERY_CENTER): Payer: Managed Care, Other (non HMO) | Admitting: Gastroenterology

## 2022-11-10 ENCOUNTER — Encounter: Payer: Self-pay | Admitting: Gastroenterology

## 2022-11-10 VITALS — BP 110/67 | HR 59 | Temp 96.2°F | Resp 16 | Ht 64.0 in | Wt 135.0 lb

## 2022-11-10 DIAGNOSIS — D122 Benign neoplasm of ascending colon: Secondary | ICD-10-CM

## 2022-11-10 DIAGNOSIS — Z09 Encounter for follow-up examination after completed treatment for conditions other than malignant neoplasm: Secondary | ICD-10-CM

## 2022-11-10 DIAGNOSIS — Z86010 Personal history of colonic polyps: Secondary | ICD-10-CM | POA: Diagnosis not present

## 2022-11-10 MED ORDER — SODIUM CHLORIDE 0.9 % IV SOLN: 500.0000 mL | Freq: Once | INTRAVENOUS | Status: DC

## 2022-11-10 NOTE — Progress Notes (Signed)
Called to room to assist during endoscopic procedure.  Patient ID and intended procedure confirmed with present staff. Received instructions for my participation in the procedure from the performing physician.  

## 2022-11-10 NOTE — Progress Notes (Signed)
Report given to PACU, vss 

## 2022-11-10 NOTE — Progress Notes (Signed)
Pt's states no medical or surgical changes since previsit or office visit. 

## 2022-11-10 NOTE — Op Note (Signed)
[facility] Patient Name: Jean Blair Procedure Date: 11/10/2022 7:53 AM MRN: [id number] Endoscopist: Viviann Spare P. Adela Lank , MD, [id number] Age: 62 Referring MD:  Date of Birth: Nov 21, 1960 Gender: Female Account #: [id number] Procedure:                Colonoscopy Indications:              High risk colon cancer surveillance: Personal                            history of colonic polyps - numerous adenomas /                            sessile serrated polyps removed 06/2019 Medicines:                Monitored Anesthesia Care Procedure:                Pre-Anesthesia Assessment:                           - Prior to the procedure, a History and Physical                            was performed, and patient medications and                            allergies were reviewed. The patient's tolerance of                            previous anesthesia was also reviewed. The risks                            and benefits of the procedure and the sedation                            options and risks were discussed with the patient.                            All questions were answered, and informed consent                            was obtained. Prior Anticoagulants: The patient has                            taken no anticoagulant or antiplatelet agents. ASA                            Grade Assessment: II - A patient with mild systemic                            disease. After reviewing the risks and benefits,                            the patient was deemed in satisfactory condition to  undergo the procedure.                           After obtaining informed consent, the colonoscope                            was passed under direct vision. Throughout the                            procedure, the patient's blood pressure, pulse, and                            oxygen saturations were monitored continuously. The                            [id number]-[id number]  Colonoscope [id number] was introduced                            through the anus and advanced to the the cecum,                            identified by appendiceal orifice and ileocecal                            valve. The colonoscopy was performed without                            difficulty. The patient tolerated the procedure                            well. The quality of the bowel preparation was                            good. The ileocecal valve, appendiceal orifice, and                            rectum were photographed. Scope In: 8:02:17 AM Scope Out: 8:19:41 AM Scope Withdrawal Time: 0 hours 12 minutes 50 seconds  Total Procedure Duration: 0 hours 17 minutes 24 seconds  Findings:                 Hemorrhoids were found on perianal exam.                           Two sessile polyps were found in the ascending                            colon. The polyps were 2 to 4 mm in size. These                            polyps were removed with a cold snare. Resection                            and retrieval were complete.  Multiple small-mouthed diverticula were found in                            the sigmoid colon.                           Internal hemorrhoids were found during retroflexion.                           The exam was otherwise without abnormality. Complications:            No immediate complications. Estimated blood loss:                            Minimal. Estimated Blood Loss:     Estimated blood loss was minimal. Impression:               - Hemorrhoids found on perianal exam.                           - Two 2 to 4 mm polyps in the ascending colon,                            removed with a cold snare. Resected and retrieved.                           - Diverticulosis in the sigmoid colon.                           - Internal hemorrhoids.                           - The examination was otherwise normal.                           - The GI Genius  (intelligent endoscopy module),                            computer-aided polyp detection system powered by AI                            was utilized to detect colorectal polyps through                            enhanced visualization during colonoscopy. Recommendation:           - Patient has a contact number available for                            emergencies. The signs and symptoms of potential                            delayed complications were discussed with the                            patient. Return to normal activities tomorrow.  Written discharge instructions were provided to the                            patient.                           - Resume previous diet.                           - Continue present medications.                           - Await pathology results. Anticipate repeat                            colonoscopy in 5 years given burden of polyps on                            the last exam in 2020 Nyeema Want P. Adela Lank, MD 11/10/2022 8:24:31 AM This report has been signed electronically.

## 2022-11-10 NOTE — Patient Instructions (Signed)
  Thank you for coming in to see [place] today. Resume previous diet and medications today. Return to regular  daily activities tomorrow. Next colonoscopy is recommended most likely in 5 years.  Dr Adela Lank will send letter after biopsies are final with this recommendation.   YOU HAD AN ENDOSCOPIC PROCEDURE TODAY AT THE [facility]:   Refer to the procedure report that was given to you for any specific questions about what was found during the examination.  If the procedure report does not answer your questions, please call your gastroenterologist to clarify.  If you requested that your care partner not be given the details of your procedure findings, then the procedure report has been included in a sealed envelope for you to review at your convenience later.  YOU SHOULD EXPECT: Some feelings of bloating in the abdomen. Passage of more gas than usual.  Walking can help get rid of the air that was put into your GI tract during the procedure and reduce the bloating. If you had a lower endoscopy (such as a colonoscopy or flexible sigmoidoscopy) you may notice spotting of blood in your stool or on the toilet paper. If you underwent a bowel prep for your procedure, you may not have a normal bowel movement for a few days.  Please Note:  You might notice some irritation and congestion in your nose or some drainage.  This is from the oxygen used during your procedure.  There is no need for concern and it should clear up in a day or so.  SYMPTOMS TO REPORT IMMEDIATELY:  Following lower endoscopy (colonoscopy or flexible sigmoidoscopy):  Excessive amounts of blood in the stool  Significant tenderness or worsening of abdominal pains  Swelling of the abdomen that is new, acute  Fever of 100F or higher   For urgent or emergent issues, a gastroenterologist can be reached at any hour by calling [phone]. Do not use MyChart messaging for urgent concerns.    DIET:  We do recommend a small  meal at first, but then you may proceed to your regular diet.  Drink plenty of fluids but you should avoid alcoholic beverages for 24 hours.  ACTIVITY:  You should plan to take it easy for the rest of today and you should NOT DRIVE or use heavy machinery until tomorrow (because of the sedation medicines used during the test).    FOLLOW UP: Our staff will call the number listed on your records the next business day following your procedure.  We will call around 7:15- 8:00 am to check on you and address any questions or concerns that you may have regarding the information given to you following your procedure. If we do not reach you, we will leave a message.     If any biopsies were taken you will be contacted by phone or by letter within the next 1-3 weeks.  Please call us at [phone] if you have not heard about the biopsies in 3 weeks.    SIGNATURES/CONFIDENTIALITY: You and/or your care partner have signed paperwork which will be entered into your electronic medical record.  These signatures attest to the fact that that the information above on your After Visit Summary has been reviewed and is understood.  Full responsibility of the confidentiality of this discharge information lies with you and/or your care-partner.

## 2022-11-10 NOTE — Progress Notes (Signed)
[facility] History and Physical   Primary Care Physician:  Zola Button, Grayling Congress, DO   Reason for Procedure:   History of colon polyps  Plan:    colonoscopy     HPI: Jean Blair is a 62 y.o. female  here for colonoscopy surveillance - last exam 07/16/2019 - numerous polyps removed.  . Patient denies any bowel symptoms at this time. No family history of colon cancer known. Otherwise feels well without any cardiopulmonary symptoms.   I have discussed risks / benefits of anesthesia and endoscopic procedure with Jean Blair and they wish to proceed with the exams as outlined today.    Past Medical History:  Diagnosis Date   Adenomatous polyps    Anemia    Anxiety    Cataract    forming    GERD (gastroesophageal reflux disease)    "use to"   Hyperlipidemia    Hypertension     Past Surgical History:  Procedure Laterality Date   COLONOSCOPY     POLYPECTOMY     TUBAL LIGATION  1991   UPPER GASTROINTESTINAL ENDOSCOPY      Prior to Admission medications   Medication Sig Start Date End Date Taking? Authorizing Provider  amLODipine (NORVASC) 5 MG tablet TAKE 1 TABLET (5 MG TOTAL) BY MOUTH DAILY. 09/14/22  Yes Seabron Spates R, DO  chlorthalidone (HYGROTON) 25 MG tablet TAKE 1 TABLET BY MOUTH EVERY DAY 03/02/22  Yes Seabron Spates R, DO  losartan (COZAAR) 50 MG tablet Take 1 tablet (50 mg total) by mouth daily. 08/22/22  Yes Seabron Spates R, DO  rosuvastatin (CRESTOR) 40 MG tablet Take 1 tablet (40 mg total) by mouth daily. Pt needs office visit for further refills 08/15/22  Yes Lowne Florina Ou R, DO  ALPRAZolam Prudy Feeler) 0.5 MG tablet Take 1 tablet (0.5 mg total) by mouth daily as needed. 05/14/20   Donato Schultz, DO  Semaglutide-Weight Management (WEGOVY) 1 MG/0.5ML SOAJ Inject 1 mg into the skin once a week. 09/21/22   Donato Schultz, DO    Current Outpatient Medications  Medication Sig Dispense Refill   amLODipine  (NORVASC) 5 MG tablet TAKE 1 TABLET (5 MG TOTAL) BY MOUTH DAILY. 90 tablet 1   chlorthalidone (HYGROTON) 25 MG tablet TAKE 1 TABLET BY MOUTH EVERY DAY 90 tablet 1   losartan (COZAAR) 50 MG tablet Take 1 tablet (50 mg total) by mouth daily. 30 tablet 0   rosuvastatin (CRESTOR) 40 MG tablet Take 1 tablet (40 mg total) by mouth daily. Pt needs office visit for further refills 90 tablet 0   ALPRAZolam (XANAX) 0.5 MG tablet Take 1 tablet (0.5 mg total) by mouth daily as needed. 30 tablet 2   Semaglutide-Weight Management (WEGOVY) 1 MG/0.5ML SOAJ Inject 1 mg into the skin once a week. 2 mL 1   Current Facility-Administered Medications  Medication Dose Route Frequency Provider Last Rate Last Admin   0.9 %  sodium chloride infusion  500 mL Intravenous Once Candee Hoon, Willaim Rayas, MD        Allergies as of 11/10/2022 - Review Complete 11/10/2022  Allergen Reaction Noted   Codeine      Family History  Problem Relation Age of Onset   Diabetes Mother    Cancer Brother        bladder   Bladder Cancer Brother    Heart disease Maternal Grandfather    Heart attack Maternal Grandfather    Anxiety disorder Other  Diabetes Other    Hypertension Other    Mental illness Other    Alcohol abuse Other    Stroke Other    Colon cancer Neg Hx    Colon polyps Neg Hx    Esophageal cancer Neg Hx    Rectal cancer Neg Hx    Stomach cancer Neg Hx    Crohn's disease Neg Hx    Ulcerative colitis Neg Hx     Social History   Socioeconomic History   Marital status: Single    Spouse name: Not on file   Number of children: Not on file   Years of education: Not on file   Highest education level: Not on file  Occupational History   Not on file  Tobacco Use   Smoking status: Every Day    Types: Cigars, Cigarettes    Last attempt to quit: 11/18/2000    Years since quitting: 21.9   Smokeless tobacco: Never  Vaping Use   Vaping Use: Never used  Substance and Sexual Activity   Alcohol use: Yes     Alcohol/week: 7.0 standard drinks of alcohol    Types: 7 Glasses of wine per week    Comment: wine daily glass   Drug use: No   Sexual activity: Not on file  Other Topics Concern   Not on file  Social History Narrative   Not on file   Social Determinants of Health   Financial Resource Strain: Not on file  Food Insecurity: Not on file  Transportation Needs: Not on file  Physical Activity: Not on file  Stress: Not on file  Social Connections: Not on file  Intimate Partner Violence: Not on file    Review of Systems: All other review of systems negative except as mentioned in the HPI.  Physical Exam: Vital signs BP 132/79   Pulse (!) 57   Temp (!) 96.2 F (35.7 C) (Skin)   Resp 13   Ht 5\' 4"  (1.626 m)   Wt 135 lb (61.2 kg)   LMP 05/17/2015   SpO2 100%   BMI 23.17 kg/m   General:   Alert,  Well-developed, pleasant and cooperative in NAD Lungs:  Clear throughout to auscultation.   Heart:  Regular rate and rhythm Abdomen:  Soft, nontender and nondistended.   Neuro/Psych:  Alert and cooperative. Normal mood and affect. A and O x 3  Harlin Rain, MD [facility]

## 2022-11-11 ENCOUNTER — Other Ambulatory Visit: Payer: Self-pay | Admitting: Family Medicine

## 2022-11-11 DIAGNOSIS — I10 Essential (primary) hypertension: Secondary | ICD-10-CM

## 2022-11-12 ENCOUNTER — Other Ambulatory Visit: Payer: Self-pay | Admitting: Family Medicine

## 2022-11-12 DIAGNOSIS — E785 Hyperlipidemia, unspecified: Secondary | ICD-10-CM

## 2022-11-13 ENCOUNTER — Telehealth: Payer: Self-pay | Admitting: *Deleted

## 2022-11-13 NOTE — Telephone Encounter (Signed)
  Follow up Call-     11/10/2022    7:29 AM  Call back number  Post procedure Call Back phone  # [phone]  Permission to leave phone message Yes     Patient questions:  Do you have a fever, pain , or abdominal swelling? No. Pain Score  0 *  Have you tolerated food without any problems? Yes.    Have you been able to return to your normal activities? Yes.    Do you have any questions about your discharge instructions: Diet   No. Medications  No. Follow up visit  No.  Do you have questions or concerns about your Care? No.  Actions: * If pain score is 4 or above: No action needed, pain <4.

## 2022-11-17 ENCOUNTER — Other Ambulatory Visit: Payer: Self-pay | Admitting: Family Medicine

## 2022-11-17 DIAGNOSIS — I10 Essential (primary) hypertension: Secondary | ICD-10-CM

## 2022-11-30 ENCOUNTER — Other Ambulatory Visit (HOSPITAL_BASED_OUTPATIENT_CLINIC_OR_DEPARTMENT_OTHER): Payer: Self-pay

## 2022-11-30 ENCOUNTER — Other Ambulatory Visit: Payer: Self-pay | Admitting: Family Medicine

## 2022-12-01 ENCOUNTER — Other Ambulatory Visit (HOSPITAL_BASED_OUTPATIENT_CLINIC_OR_DEPARTMENT_OTHER): Payer: Self-pay

## 2022-12-01 ENCOUNTER — Encounter: Payer: Self-pay | Admitting: Family Medicine

## 2022-12-01 MED ORDER — SEMAGLUTIDE-WEIGHT MANAGEMENT 1.7 MG/0.75ML ~~LOC~~ SOAJ
1.7000 mg | SUBCUTANEOUS | 0 refills | Status: DC
  Filled 2022-12-01: qty 3, 28d supply, fill #0

## 2022-12-01 NOTE — Telephone Encounter (Signed)
Please advise if increasing the dose.

## 2022-12-02 ENCOUNTER — Other Ambulatory Visit (HOSPITAL_BASED_OUTPATIENT_CLINIC_OR_DEPARTMENT_OTHER): Payer: Self-pay

## 2022-12-12 ENCOUNTER — Other Ambulatory Visit: Payer: Self-pay | Admitting: Family Medicine

## 2022-12-12 DIAGNOSIS — I10 Essential (primary) hypertension: Secondary | ICD-10-CM

## 2022-12-12 DIAGNOSIS — E785 Hyperlipidemia, unspecified: Secondary | ICD-10-CM

## 2022-12-16 ENCOUNTER — Other Ambulatory Visit: Payer: Self-pay | Admitting: Family Medicine

## 2022-12-16 DIAGNOSIS — E785 Hyperlipidemia, unspecified: Secondary | ICD-10-CM

## 2022-12-16 DIAGNOSIS — I10 Essential (primary) hypertension: Secondary | ICD-10-CM

## 2022-12-20 ENCOUNTER — Other Ambulatory Visit: Payer: Self-pay | Admitting: Family Medicine

## 2022-12-21 ENCOUNTER — Other Ambulatory Visit (HOSPITAL_BASED_OUTPATIENT_CLINIC_OR_DEPARTMENT_OTHER): Payer: Self-pay

## 2022-12-21 MED ORDER — WEGOVY 2.4 MG/0.75ML ~~LOC~~ SOAJ
2.4000 mg | SUBCUTANEOUS | 3 refills | Status: DC
  Filled 2022-12-21: qty 3, 28d supply, fill #0
  Filled 2023-01-15 – 2023-02-02 (×2): qty 3, 28d supply, fill #1

## 2022-12-22 ENCOUNTER — Other Ambulatory Visit (HOSPITAL_BASED_OUTPATIENT_CLINIC_OR_DEPARTMENT_OTHER): Payer: Self-pay

## 2022-12-23 ENCOUNTER — Other Ambulatory Visit (HOSPITAL_BASED_OUTPATIENT_CLINIC_OR_DEPARTMENT_OTHER): Payer: Self-pay

## 2023-01-14 ENCOUNTER — Other Ambulatory Visit: Payer: Self-pay | Admitting: Family Medicine

## 2023-01-14 DIAGNOSIS — I10 Essential (primary) hypertension: Secondary | ICD-10-CM

## 2023-01-14 DIAGNOSIS — E785 Hyperlipidemia, unspecified: Secondary | ICD-10-CM

## 2023-01-18 ENCOUNTER — Other Ambulatory Visit: Payer: Self-pay | Admitting: Family Medicine

## 2023-01-18 DIAGNOSIS — I10 Essential (primary) hypertension: Secondary | ICD-10-CM

## 2023-01-18 DIAGNOSIS — E785 Hyperlipidemia, unspecified: Secondary | ICD-10-CM

## 2023-01-29 ENCOUNTER — Other Ambulatory Visit (HOSPITAL_BASED_OUTPATIENT_CLINIC_OR_DEPARTMENT_OTHER): Payer: Self-pay

## 2023-02-02 ENCOUNTER — Other Ambulatory Visit (HOSPITAL_BASED_OUTPATIENT_CLINIC_OR_DEPARTMENT_OTHER): Payer: Self-pay

## 2023-02-14 ENCOUNTER — Other Ambulatory Visit: Payer: Self-pay | Admitting: Family Medicine

## 2023-02-14 DIAGNOSIS — I10 Essential (primary) hypertension: Secondary | ICD-10-CM

## 2023-02-14 DIAGNOSIS — E785 Hyperlipidemia, unspecified: Secondary | ICD-10-CM

## 2023-02-18 ENCOUNTER — Other Ambulatory Visit: Payer: Self-pay | Admitting: Family Medicine

## 2023-02-18 DIAGNOSIS — I10 Essential (primary) hypertension: Secondary | ICD-10-CM

## 2023-02-18 DIAGNOSIS — E785 Hyperlipidemia, unspecified: Secondary | ICD-10-CM

## 2023-02-25 ENCOUNTER — Other Ambulatory Visit: Payer: Self-pay | Admitting: Family Medicine

## 2023-02-25 DIAGNOSIS — I10 Essential (primary) hypertension: Secondary | ICD-10-CM

## 2023-03-01 ENCOUNTER — Other Ambulatory Visit (HOSPITAL_BASED_OUTPATIENT_CLINIC_OR_DEPARTMENT_OTHER): Payer: Self-pay

## 2023-03-01 ENCOUNTER — Encounter: Payer: Self-pay | Admitting: Family Medicine

## 2023-03-01 ENCOUNTER — Ambulatory Visit: Payer: Managed Care, Other (non HMO) | Admitting: Family Medicine

## 2023-03-01 DIAGNOSIS — M47816 Spondylosis without myelopathy or radiculopathy, lumbar region: Secondary | ICD-10-CM

## 2023-03-01 DIAGNOSIS — E785 Hyperlipidemia, unspecified: Secondary | ICD-10-CM | POA: Diagnosis not present

## 2023-03-01 DIAGNOSIS — F419 Anxiety disorder, unspecified: Secondary | ICD-10-CM

## 2023-03-01 DIAGNOSIS — M545 Low back pain, unspecified: Secondary | ICD-10-CM

## 2023-03-01 DIAGNOSIS — L700 Acne vulgaris: Secondary | ICD-10-CM | POA: Diagnosis not present

## 2023-03-01 DIAGNOSIS — I10 Essential (primary) hypertension: Secondary | ICD-10-CM | POA: Diagnosis not present

## 2023-03-01 DIAGNOSIS — R002 Palpitations: Secondary | ICD-10-CM

## 2023-03-01 DIAGNOSIS — E663 Overweight: Secondary | ICD-10-CM

## 2023-03-01 LAB — POC URINALSYSI DIPSTICK (AUTOMATED)
Bilirubin, UA: NEGATIVE
Glucose, UA: NEGATIVE
Ketones, UA: NEGATIVE
Nitrite, UA: NEGATIVE
Protein, UA: NEGATIVE
Spec Grav, UA: <=1.005 — AB (ref 1.010–1.025)
Urobilinogen, UA: 0.2 E.U./dL
pH, UA: 7.5 (ref 5.0–8.0)

## 2023-03-01 LAB — COMPREHENSIVE METABOLIC PANEL
ALT: 18 U/L (ref 0–35)
AST: 16 U/L (ref 0–37)
Albumin: 4.4 g/dL (ref 3.5–5.2)
Alkaline Phosphatase: 47 U/L (ref 39–117)
BUN: 15 mg/dL (ref 6–23)
CO2: 29 mEq/L (ref 19–32)
Calcium: 10.5 mg/dL (ref 8.4–10.5)
Chloride: 99 mEq/L (ref 96–112)
Creatinine, Ser: 0.82 mg/dL (ref 0.40–1.20)
GFR: 77.91 mL/min (ref >60.00)
Glucose, Bld: 89 mg/dL (ref 70–99)
Potassium: 3.3 mEq/L — ABNORMAL LOW (ref 3.5–5.1)
Sodium: 137 mEq/L (ref 135–145)
Total Bilirubin: 0.5 mg/dL (ref 0.2–1.2)
Total Protein: 6.8 g/dL (ref 6.0–8.3)

## 2023-03-01 LAB — CBC WITH DIFFERENTIAL/PLATELET
Basophils Absolute: 0.1 10*3/uL (ref 0.0–0.1)
Basophils Relative: 0.9 % (ref 0.0–3.0)
Eosinophils Absolute: 0.2 10*3/uL (ref 0.0–0.7)
Eosinophils Relative: 1 % (ref 0.0–5.0)
HCT: 43.4 % (ref 36.0–46.0)
Hemoglobin: 14.4 g/dL (ref 12.0–15.0)
Lymphocytes Relative: 18.7 % (ref 12.0–46.0)
Lymphs Abs: 2.8 10*3/uL (ref 0.7–4.0)
MCHC: 33.1 g/dL (ref 30.0–36.0)
MCV: 92.6 fl (ref 78.0–100.0)
Monocytes Absolute: 0.6 10*3/uL (ref 0.1–1.0)
Monocytes Relative: 4 % (ref 3.0–12.0)
Neutro Abs: 11.5 10*3/uL — ABNORMAL HIGH (ref 1.4–7.7)
Neutrophils Relative %: 75.4 % (ref 43.0–77.0)
Platelets: 348 10*3/uL (ref 150.0–400.0)
RBC: 4.69 Mil/uL (ref 3.87–5.11)
RDW: 13.4 % (ref 11.5–15.5)
WBC: 15.2 10*3/uL — ABNORMAL HIGH (ref 4.0–10.5)

## 2023-03-01 LAB — TSH: TSH: 1.84 u[IU]/mL (ref 0.35–5.50)

## 2023-03-01 LAB — LIPID PANEL
Cholesterol: 158 mg/dL (ref 0–200)
HDL: 49.7 mg/dL (ref >39.00)
LDL Cholesterol: 91 mg/dL (ref 0–99)
NonHDL: 108.65
Total CHOL/HDL Ratio: 3
Triglycerides: 90 mg/dL (ref 0.0–149.0)
VLDL: 18 mg/dL (ref 0.0–40.0)

## 2023-03-01 MED ORDER — KETOROLAC TROMETHAMINE 60 MG/2ML IM SOLN
60.0000 mg | Freq: Once | INTRAMUSCULAR | Status: AC
Start: 2023-03-01 — End: 2023-03-01
  Administered 2023-03-01: 60 mg via INTRAMUSCULAR

## 2023-03-01 MED ORDER — WEGOVY 2.4 MG/0.75ML ~~LOC~~ SOAJ
2.4000 mg | SUBCUTANEOUS | 3 refills | Status: DC
  Filled 2023-03-01: qty 3, 28d supply, fill #0
  Filled 2023-04-05: qty 3, 28d supply, fill #1
  Filled 2023-05-19: qty 3, 28d supply, fill #2
  Filled 2023-06-16: qty 3, 28d supply, fill #3

## 2023-03-01 MED ORDER — LOSARTAN POTASSIUM 50 MG PO TABS: 50.0000 mg | ORAL_TABLET | Freq: Every day | ORAL | 1 refills | Status: DC

## 2023-03-01 MED ORDER — CHLORTHALIDONE 25 MG PO TABS: 25.0000 mg | ORAL_TABLET | Freq: Every day | ORAL | 1 refills | Status: DC

## 2023-03-01 MED ORDER — CYCLOBENZAPRINE HCL 10 MG PO TABS: 10.0000 mg | ORAL_TABLET | Freq: Three times a day (TID) | ORAL | 0 refills | Status: DC | PRN

## 2023-03-01 MED ORDER — ROSUVASTATIN CALCIUM 40 MG PO TABS
40.0000 mg | ORAL_TABLET | Freq: Every day | ORAL | 1 refills | Status: DC
Start: 2023-03-01 — End: 2023-08-02

## 2023-03-01 MED ORDER — TRETINOIN 0.025 % EX CREA: TOPICAL_CREAM | Freq: Every day | CUTANEOUS | 5 refills | Status: DC

## 2023-03-01 MED ORDER — AMLODIPINE BESYLATE 5 MG PO TABS: 5.0000 mg | ORAL_TABLET | Freq: Every day | ORAL | 1 refills | Status: DC

## 2023-03-01 NOTE — Assessment & Plan Note (Signed)
Cont wegovy 

## 2023-03-01 NOTE — Assessment & Plan Note (Signed)
stable °

## 2023-03-01 NOTE — Assessment & Plan Note (Signed)
Retin a ' As needed

## 2023-03-01 NOTE — Assessment & Plan Note (Signed)
Well controlled, no changes to meds. Encouraged heart healthy diet such as the DASH diet and exercise as tolerated.  °

## 2023-03-01 NOTE — Assessment & Plan Note (Signed)
Toradol IM given Flexeril tid as needed  Return to office as needed
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
## 2023-03-01 NOTE — Assessment & Plan Note (Signed)
Encourage heart healthy diet such as MIND or DASH diet, increase exercise, avoid trans fats, simple carbohydrates and processed foods, consider a krill or fish or flaxseed oil cap daily.  °

## 2023-03-02 ENCOUNTER — Other Ambulatory Visit: Payer: Self-pay | Admitting: Family Medicine

## 2023-03-02 DIAGNOSIS — D72829 Elevated white blood cell count, unspecified: Secondary | ICD-10-CM

## 2023-03-02 MED ORDER — ALPRAZOLAM 0.5 MG PO TABS
0.5000 mg | ORAL_TABLET | Freq: Every day | ORAL | 2 refills | Status: DC | PRN
Start: 2023-03-02 — End: 2024-02-12

## 2023-03-02 MED ORDER — TRETINOIN 0.025 % EX CREA: TOPICAL_CREAM | Freq: Every day | CUTANEOUS | 5 refills | Status: DC

## 2023-03-02 NOTE — Addendum Note (Signed)
Addended by: Roxanne Gates on: 03/02/2023 09:38 AM   Modules accepted: Orders

## 2023-03-02 NOTE — Telephone Encounter (Signed)
Requesting: Xanax Contract: 2020 UDS: 2020 Last OV: 03/01/23 Next OV: N/A Last Refill: 05/14/2020, #30--2 RF Database:   Please advise

## 2023-03-03 ENCOUNTER — Other Ambulatory Visit (HOSPITAL_BASED_OUTPATIENT_CLINIC_OR_DEPARTMENT_OTHER): Payer: Self-pay

## 2023-03-08 ENCOUNTER — Other Ambulatory Visit (HOSPITAL_COMMUNITY): Payer: Self-pay

## 2023-03-08 ENCOUNTER — Telehealth: Payer: Self-pay

## 2023-03-08 NOTE — Telephone Encounter (Signed)
Pharmacy Patient Advocate Encounter  Received notification from [employer] that Prior Authorization for Tretinoin 0.025% cream has been APPROVED from 03/08/23 to 03/07/24. Ran test claim, Copay is $40. This test claim was processed through [facility]- copay amounts may vary at other pharmacies due to pharmacy/plan contracts, or as the patient moves through the different stages of their insurance plan.   PA #/Case ID/Reference #: [id number]

## 2023-03-08 NOTE — Telephone Encounter (Signed)
Pharmacy Patient Advocate Encounter   Received notification from CoverMyMeds that prior authorization for Tretinoin 0.025% cream is required/requested.   Insurance verification completed.   The patient is insured through [employer] .   Per test claim: PA required; PA submitted to [employer] via Phone Key/confirmation #/[id number] Status is pending

## 2023-04-05 ENCOUNTER — Other Ambulatory Visit (HOSPITAL_BASED_OUTPATIENT_CLINIC_OR_DEPARTMENT_OTHER): Payer: Self-pay

## 2023-05-19 ENCOUNTER — Other Ambulatory Visit (HOSPITAL_BASED_OUTPATIENT_CLINIC_OR_DEPARTMENT_OTHER): Payer: Self-pay

## 2023-06-16 ENCOUNTER — Other Ambulatory Visit (HOSPITAL_BASED_OUTPATIENT_CLINIC_OR_DEPARTMENT_OTHER): Payer: Self-pay

## 2023-06-28 ENCOUNTER — Other Ambulatory Visit: Payer: Self-pay | Admitting: Family Medicine

## 2023-06-28 DIAGNOSIS — I10 Essential (primary) hypertension: Secondary | ICD-10-CM

## 2023-07-14 ENCOUNTER — Other Ambulatory Visit: Payer: Self-pay | Admitting: Family Medicine

## 2023-07-16 ENCOUNTER — Other Ambulatory Visit (HOSPITAL_BASED_OUTPATIENT_CLINIC_OR_DEPARTMENT_OTHER): Payer: Self-pay

## 2023-07-16 MED ORDER — WEGOVY 2.4 MG/0.75ML ~~LOC~~ SOAJ
2.4000 mg | SUBCUTANEOUS | 0 refills | Status: DC
  Filled 2023-07-16: qty 3, 28d supply, fill #0

## 2023-07-19 ENCOUNTER — Other Ambulatory Visit: Payer: Self-pay | Admitting: Family Medicine

## 2023-07-19 ENCOUNTER — Other Ambulatory Visit (HOSPITAL_BASED_OUTPATIENT_CLINIC_OR_DEPARTMENT_OTHER): Payer: Self-pay

## 2023-07-29 ENCOUNTER — Other Ambulatory Visit: Payer: Self-pay | Admitting: Family Medicine

## 2023-07-29 DIAGNOSIS — I10 Essential (primary) hypertension: Secondary | ICD-10-CM

## 2023-08-02 ENCOUNTER — Other Ambulatory Visit (HOSPITAL_BASED_OUTPATIENT_CLINIC_OR_DEPARTMENT_OTHER): Payer: Self-pay

## 2023-08-02 ENCOUNTER — Encounter (HOSPITAL_BASED_OUTPATIENT_CLINIC_OR_DEPARTMENT_OTHER): Payer: Self-pay

## 2023-08-02 ENCOUNTER — Other Ambulatory Visit: Payer: Self-pay | Admitting: Family Medicine

## 2023-08-02 DIAGNOSIS — E785 Hyperlipidemia, unspecified: Secondary | ICD-10-CM

## 2023-08-02 DIAGNOSIS — I10 Essential (primary) hypertension: Secondary | ICD-10-CM

## 2023-08-02 MED ORDER — WEGOVY 2.4 MG/0.75ML ~~LOC~~ SOAJ
2.4000 mg | SUBCUTANEOUS | 1 refills | Status: DC
  Filled 2023-08-02 – 2023-08-06 (×2): qty 3, 28d supply, fill #0
  Filled 2023-09-06: qty 3, 28d supply, fill #1

## 2023-08-06 ENCOUNTER — Other Ambulatory Visit (HOSPITAL_BASED_OUTPATIENT_CLINIC_OR_DEPARTMENT_OTHER): Payer: Self-pay

## 2023-08-06 ENCOUNTER — Telehealth: Payer: Self-pay

## 2023-08-06 NOTE — Telephone Encounter (Signed)
Appt scheduled 08/16/23

## 2023-08-06 NOTE — Telephone Encounter (Signed)
Needs in person appt for updated wt and bmi please.

## 2023-08-07 ENCOUNTER — Other Ambulatory Visit (HOSPITAL_BASED_OUTPATIENT_CLINIC_OR_DEPARTMENT_OTHER): Payer: Self-pay

## 2023-08-13 ENCOUNTER — Other Ambulatory Visit (HOSPITAL_COMMUNITY): Payer: Self-pay

## 2023-08-16 ENCOUNTER — Ambulatory Visit: Payer: Managed Care, Other (non HMO) | Admitting: Family Medicine

## 2023-08-16 NOTE — Telephone Encounter (Signed)
Pt cancelled appt, has not rescheduled.

## 2023-08-21 DIAGNOSIS — Z1231 Encounter for screening mammogram for malignant neoplasm of breast: Secondary | ICD-10-CM | POA: Diagnosis not present

## 2023-08-21 LAB — HM MAMMOGRAPHY

## 2023-08-22 ENCOUNTER — Encounter: Payer: Self-pay | Admitting: Family Medicine

## 2023-08-27 ENCOUNTER — Other Ambulatory Visit: Payer: Self-pay | Admitting: Family Medicine

## 2023-08-27 DIAGNOSIS — I10 Essential (primary) hypertension: Secondary | ICD-10-CM

## 2023-08-28 ENCOUNTER — Other Ambulatory Visit: Payer: Self-pay | Admitting: Family Medicine

## 2023-08-28 DIAGNOSIS — I10 Essential (primary) hypertension: Secondary | ICD-10-CM

## 2023-10-13 ENCOUNTER — Other Ambulatory Visit: Payer: Self-pay | Admitting: Family Medicine

## 2023-10-13 ENCOUNTER — Other Ambulatory Visit (HOSPITAL_BASED_OUTPATIENT_CLINIC_OR_DEPARTMENT_OTHER): Payer: Self-pay

## 2023-10-16 ENCOUNTER — Other Ambulatory Visit (HOSPITAL_BASED_OUTPATIENT_CLINIC_OR_DEPARTMENT_OTHER): Payer: Self-pay

## 2023-10-16 MED ORDER — WEGOVY 2.4 MG/0.75ML ~~LOC~~ SOAJ
2.4000 mg | SUBCUTANEOUS | 1 refills | Status: DC
  Filled 2023-10-16 – 2023-10-29 (×3): qty 3, 28d supply, fill #0
  Filled 2023-12-08: qty 3, 28d supply, fill #1

## 2023-10-26 ENCOUNTER — Other Ambulatory Visit (HOSPITAL_BASED_OUTPATIENT_CLINIC_OR_DEPARTMENT_OTHER): Payer: Self-pay

## 2023-10-29 ENCOUNTER — Other Ambulatory Visit (HOSPITAL_BASED_OUTPATIENT_CLINIC_OR_DEPARTMENT_OTHER): Payer: Self-pay

## 2023-10-30 ENCOUNTER — Other Ambulatory Visit: Payer: Self-pay | Admitting: Family Medicine

## 2023-10-30 DIAGNOSIS — I10 Essential (primary) hypertension: Secondary | ICD-10-CM

## 2023-10-30 DIAGNOSIS — E785 Hyperlipidemia, unspecified: Secondary | ICD-10-CM

## 2023-11-23 ENCOUNTER — Other Ambulatory Visit: Payer: Self-pay | Admitting: Family Medicine

## 2023-11-23 DIAGNOSIS — I10 Essential (primary) hypertension: Secondary | ICD-10-CM

## 2023-12-02 ENCOUNTER — Other Ambulatory Visit: Payer: Self-pay | Admitting: Family Medicine

## 2023-12-02 DIAGNOSIS — I10 Essential (primary) hypertension: Secondary | ICD-10-CM

## 2023-12-02 DIAGNOSIS — E785 Hyperlipidemia, unspecified: Secondary | ICD-10-CM

## 2023-12-08 ENCOUNTER — Other Ambulatory Visit (HOSPITAL_BASED_OUTPATIENT_CLINIC_OR_DEPARTMENT_OTHER): Payer: Self-pay

## 2023-12-10 ENCOUNTER — Other Ambulatory Visit: Payer: Self-pay | Admitting: Family Medicine

## 2023-12-10 DIAGNOSIS — E785 Hyperlipidemia, unspecified: Secondary | ICD-10-CM

## 2023-12-10 DIAGNOSIS — I10 Essential (primary) hypertension: Secondary | ICD-10-CM

## 2023-12-14 ENCOUNTER — Other Ambulatory Visit: Payer: Self-pay | Admitting: Family Medicine

## 2023-12-14 DIAGNOSIS — E785 Hyperlipidemia, unspecified: Secondary | ICD-10-CM

## 2023-12-14 DIAGNOSIS — I10 Essential (primary) hypertension: Secondary | ICD-10-CM

## 2023-12-19 ENCOUNTER — Other Ambulatory Visit: Payer: Self-pay | Admitting: Family Medicine

## 2023-12-20 ENCOUNTER — Other Ambulatory Visit (HOSPITAL_BASED_OUTPATIENT_CLINIC_OR_DEPARTMENT_OTHER): Payer: Self-pay

## 2023-12-20 MED ORDER — WEGOVY 2.4 MG/0.75ML ~~LOC~~ SOAJ
2.4000 mg | SUBCUTANEOUS | 0 refills | Status: DC
  Filled 2023-12-20 – 2024-01-12 (×2): qty 3, 28d supply, fill #0

## 2024-01-09 ENCOUNTER — Other Ambulatory Visit: Payer: Self-pay | Admitting: Family Medicine

## 2024-01-09 ENCOUNTER — Encounter: Payer: Self-pay | Admitting: Family Medicine

## 2024-01-09 DIAGNOSIS — I10 Essential (primary) hypertension: Secondary | ICD-10-CM

## 2024-01-12 ENCOUNTER — Other Ambulatory Visit: Payer: Self-pay | Admitting: Family Medicine

## 2024-01-12 ENCOUNTER — Other Ambulatory Visit (HOSPITAL_BASED_OUTPATIENT_CLINIC_OR_DEPARTMENT_OTHER): Payer: Self-pay

## 2024-01-12 DIAGNOSIS — E785 Hyperlipidemia, unspecified: Secondary | ICD-10-CM

## 2024-01-14 ENCOUNTER — Other Ambulatory Visit: Payer: Self-pay | Admitting: Family Medicine

## 2024-01-14 DIAGNOSIS — I10 Essential (primary) hypertension: Secondary | ICD-10-CM

## 2024-01-19 ENCOUNTER — Other Ambulatory Visit: Payer: Self-pay | Admitting: Family Medicine

## 2024-01-19 DIAGNOSIS — I10 Essential (primary) hypertension: Secondary | ICD-10-CM

## 2024-02-06 ENCOUNTER — Other Ambulatory Visit (HOSPITAL_BASED_OUTPATIENT_CLINIC_OR_DEPARTMENT_OTHER): Payer: Self-pay

## 2024-02-06 ENCOUNTER — Encounter: Payer: Self-pay | Admitting: Family Medicine

## 2024-02-06 DIAGNOSIS — E785 Hyperlipidemia, unspecified: Secondary | ICD-10-CM

## 2024-02-06 DIAGNOSIS — I10 Essential (primary) hypertension: Secondary | ICD-10-CM

## 2024-02-06 MED ORDER — WEGOVY 2.4 MG/0.75ML ~~LOC~~ SOAJ: 2.4000 mg | SUBCUTANEOUS | 0 refills | Status: DC

## 2024-02-06 MED ORDER — LOSARTAN POTASSIUM 50 MG PO TABS: 50.0000 mg | ORAL_TABLET | Freq: Every day | ORAL | 0 refills | Status: DC

## 2024-02-06 MED ORDER — AMLODIPINE BESYLATE 5 MG PO TABS: 5.0000 mg | ORAL_TABLET | Freq: Every day | ORAL | 0 refills | Status: DC

## 2024-02-06 MED ORDER — ROSUVASTATIN CALCIUM 40 MG PO TABS: 40.0000 mg | ORAL_TABLET | Freq: Every day | ORAL | 0 refills | Status: DC

## 2024-02-06 MED ORDER — CHLORTHALIDONE 25 MG PO TABS: 25.0000 mg | ORAL_TABLET | Freq: Every day | ORAL | 0 refills | Status: DC

## 2024-02-06 NOTE — Addendum Note (Signed)
 Addended by: ELOUISE POWELL HERO on: 02/06/2024 04:35 PM   Modules accepted: Orders

## 2024-02-12 ENCOUNTER — Encounter: Payer: Self-pay | Admitting: Family Medicine

## 2024-02-12 ENCOUNTER — Ambulatory Visit (INDEPENDENT_AMBULATORY_CARE_PROVIDER_SITE_OTHER): Payer: Self-pay | Admitting: Family Medicine

## 2024-02-12 VITALS — BP 124/70 | HR 96 | Temp 98.2°F | Ht 64.0 in | Wt 140.0 lb

## 2024-02-12 DIAGNOSIS — M47816 Spondylosis without myelopathy or radiculopathy, lumbar region: Secondary | ICD-10-CM

## 2024-02-12 DIAGNOSIS — E785 Hyperlipidemia, unspecified: Secondary | ICD-10-CM

## 2024-02-12 DIAGNOSIS — L700 Acne vulgaris: Secondary | ICD-10-CM

## 2024-02-12 DIAGNOSIS — B353 Tinea pedis: Secondary | ICD-10-CM

## 2024-02-12 DIAGNOSIS — F419 Anxiety disorder, unspecified: Secondary | ICD-10-CM

## 2024-02-12 DIAGNOSIS — I10 Essential (primary) hypertension: Secondary | ICD-10-CM

## 2024-02-12 DIAGNOSIS — B351 Tinea unguium: Secondary | ICD-10-CM

## 2024-02-12 MED ORDER — CHLORTHALIDONE 25 MG PO TABS: 25.0000 mg | ORAL_TABLET | Freq: Every day | ORAL | 1 refills | Status: AC

## 2024-02-12 MED ORDER — CYCLOBENZAPRINE HCL 10 MG PO TABS
10.0000 mg | ORAL_TABLET | Freq: Three times a day (TID) | ORAL | 0 refills | Status: AC | PRN
Start: 2024-02-12 — End: ?

## 2024-02-12 MED ORDER — ALPRAZOLAM 0.5 MG PO TABS: 0.5000 mg | ORAL_TABLET | Freq: Every day | ORAL | 2 refills | Status: AC | PRN

## 2024-02-12 MED ORDER — KETOROLAC TROMETHAMINE 60 MG/2ML IM SOLN
60.0000 mg | Freq: Once | INTRAMUSCULAR | Status: AC
  Administered 2024-02-12: 60 mg via INTRAMUSCULAR

## 2024-02-12 MED ORDER — ROSUVASTATIN CALCIUM 40 MG PO TABS: 40.0000 mg | ORAL_TABLET | Freq: Every day | ORAL | 1 refills | Status: AC

## 2024-02-12 MED ORDER — AMLODIPINE BESYLATE 5 MG PO TABS: 5.0000 mg | ORAL_TABLET | Freq: Every day | ORAL | 1 refills | Status: AC

## 2024-02-12 MED ORDER — CLOTRIMAZOLE-BETAMETHASONE 1-0.05 % EX CREA: 1.0000 | TOPICAL_CREAM | Freq: Every day | CUTANEOUS | 0 refills | Status: DC

## 2024-02-12 MED ORDER — TRETINOIN 0.025 % EX CREA: TOPICAL_CREAM | Freq: Every day | CUTANEOUS | 5 refills | Status: AC

## 2024-02-12 MED ORDER — WEGOVY 2.4 MG/0.75ML ~~LOC~~ SOAJ: 2.4000 mg | SUBCUTANEOUS | 0 refills | Status: DC

## 2024-02-12 MED ORDER — LOSARTAN POTASSIUM 50 MG PO TABS: 50.0000 mg | ORAL_TABLET | Freq: Every day | ORAL | 1 refills | Status: AC
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
## 2024-02-12 NOTE — Assessment & Plan Note (Signed)
 Encourage heart healthy diet such as MIND or DASH diet, increase exercise, avoid trans fats, simple carbohydrates and processed foods, consider a krill or fish or flaxseed oil cap daily.

## 2024-02-12 NOTE — Assessment & Plan Note (Signed)
 PT ordered.

## 2024-02-12 NOTE — Assessment & Plan Note (Signed)
 Well controlled, no changes to meds. Encouraged heart healthy diet such as the DASH diet and exercise as tolerated.

## 2024-02-14 LAB — DRUG MONITORING PANEL 376104, URINE
Amphetamines: NEGATIVE ng/mL (ref <500)
Barbiturates: NEGATIVE ng/mL (ref <300)
Benzodiazepines: NEGATIVE ng/mL (ref <100)
Cocaine Metabolite: NEGATIVE ng/mL (ref <150)
Desmethyltramadol: NEGATIVE ng/mL (ref <100)
Opiates: NEGATIVE ng/mL (ref <100)
Oxycodone: NEGATIVE ng/mL (ref <100)
Tramadol: NEGATIVE ng/mL (ref <100)

## 2024-02-14 LAB — DM TEMPLATE

## 2024-02-25 ENCOUNTER — Encounter: Payer: Self-pay | Admitting: Podiatry

## 2024-02-25 ENCOUNTER — Ambulatory Visit: Payer: Self-pay | Admitting: Podiatry

## 2024-02-25 VITALS — Ht 64.0 in | Wt 140.0 lb

## 2024-02-25 DIAGNOSIS — Z79899 Other long term (current) drug therapy: Secondary | ICD-10-CM | POA: Diagnosis not present

## 2024-02-25 MED ORDER — CLOTRIMAZOLE-BETAMETHASONE 1-0.05 % EX CREA: 1.0000 | TOPICAL_CREAM | Freq: Every day | CUTANEOUS | 2 refills | Status: AC

## 2024-02-25 MED ORDER — TERBINAFINE HCL 250 MG PO TABS: 250.0000 mg | ORAL_TABLET | Freq: Every day | ORAL | 0 refills | Status: AC

## 2024-02-25 NOTE — Progress Notes (Signed)
   Chief Complaint  Patient presents with   Nail Problem    Pt is here due to left great toenail states she thinks she has a toenail fungus, also complains of dry to the bottom of both feet.    Subjective: 63 y.o. female presenting today for evaluation of peeling skin to the weightbearing surface of the bilateral feet.  She also states that she has a history of a toenail fungus to the left hallux.  She has tried OTC topical antifungals with no improvement  Past Medical History:  Diagnosis Date   Adenomatous polyps    Anemia    Anxiety    Cataract    forming    GERD (gastroesophageal reflux disease)    use to   Hyperlipidemia    Hypertension     Past Surgical History:  Procedure Laterality Date   COLONOSCOPY     POLYPECTOMY     TUBAL LIGATION  1991   UPPER GASTROINTESTINAL ENDOSCOPY      Allergies  Allergen Reactions   Codeine     REACTION: itching    Objective: Physical Exam General: The patient is alert and oriented x3 in no acute distress.  Dermatology: Hyperkeratotic, discolored, thickened, onychodystrophy noted. Skin is warm, dry and supple bilateral lower extremities. Negative for open lesions or macerations.  Diffuse hyperkeratotic skin also noted with peeling to the weightbearing surfaces of the bilateral feet and in between the toes consistent with tinea pedis  Vascular: Palpable pedal pulses bilaterally. No edema or erythema noted. Capillary refill within normal limits.  Neurological: Grossly intact via light touch  Musculoskeletal Exam: No pedal deformity noted  Assessment: #1 Onychomycosis of toenails #2 tinea pedis bilateral  Plan of Care:  -Patient was evaluated. -Today we discussed different treatment options including oral, topical, and laser antifungal treatment modalities.  We discussed their efficacies and side effects.  Patient opts for oral antifungal treatment modality -Prescription for Lamisil  250 mg #90 daily. Pt denies a history of  liver pathology or symptoms.  -Order placed for hepatic function panel.  Discontinue if abnormal -Prescription for Lotrisone  cream apply twice daily -Return to clinic PRN   Thresa EMERSON Sar, DPM [facility]  Dr. Thresa EMERSON Sar, DPM    [address] [place], [place] [address]                Office [phone]  Fax [phone]

## 2024-05-05 ENCOUNTER — Encounter: Payer: Self-pay | Admitting: Family Medicine

## 2024-05-07 ENCOUNTER — Ambulatory Visit: Admitting: Medical

## 2024-05-07 VITALS — BP 122/80 | HR 64 | Temp 98.3°F | Resp 15 | Ht 64.0 in | Wt 141.0 lb

## 2024-05-07 DIAGNOSIS — R0982 Postnasal drip: Secondary | ICD-10-CM

## 2024-05-07 DIAGNOSIS — R0981 Nasal congestion: Secondary | ICD-10-CM | POA: Diagnosis not present

## 2024-05-07 DIAGNOSIS — R051 Acute cough: Secondary | ICD-10-CM

## 2024-05-07 LAB — POCT INFLUENZA A/B
Influenza A, POC: NEGATIVE
Influenza B, POC: NEGATIVE

## 2024-05-07 LAB — POC COVID19 BINAXNOW: SARS Coronavirus 2 Ag: NEGATIVE

## 2024-05-07 MED ORDER — BENZONATATE 100 MG PO CAPS: 100.0000 mg | ORAL_CAPSULE | Freq: Three times a day (TID) | ORAL | 0 refills | Status: AC | PRN

## 2024-05-07 MED ORDER — FLUTICASONE PROPIONATE 50 MCG/ACT NA SUSP
2.0000 | Freq: Every day | NASAL | 1 refills | Status: DC
Start: 2024-05-07 — End: 2024-06-02

## 2024-05-07 MED ORDER — AZITHROMYCIN 250 MG PO TABS: ORAL_TABLET | ORAL | 0 refills | Status: AC

## 2024-05-07 NOTE — Progress Notes (Signed)
 Subjective:    Patient ID: Jean Blair, female    DOB: 1961/01/24, 63 y.o.   MRN: [id number]  HPI   A 63 year old female presents with  nasal congestion, cough  and pnd.  Symptoms began last Thursday morning with nasal congestion and the need to blow her nose, which is unusual for her. By Friday, after returning from a work trip to [place] , she experienced sniffling and nasal congestion. Her symptoms include throat drainage, coughing, and nasal congestion, with occasional sneezing.  She denies any history of seasonal allergies and rarely gets sick. She was exposed to a potentially sick environment at her hotel in [place] , where the restaurant was closed due to staff illness.  She uses Vicks regularly and drinks hot herbal tea with a cough drop at night to manage her symptoms. She reports clear mucus and denies sinus pressure or significant chest congestion, although she notes a slight increase in sighing and a feeling of chest congestion.  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         Review of Systems  Constitutional:  Negative for chills and fever.  HENT:  Positive for congestion, postnasal drip and sneezing. Negative for hearing loss.        Sniffling.  Respiratory:  Positive for cough. Negative for choking and wheezing.        Slight chest congested.  Cardiovascular:  Negative for chest pain and palpitations.  Gastrointestinal:  Negative for abdominal pain.  Musculoskeletal:  Negative for back pain and myalgias.  Neurological:  Negative for facial asymmetry and light-headedness.  Hematological:  Negative for adenopathy.  Psychiatric/Behavioral:  Negative for confusion.     Past Medical History:  Diagnosis Date   Adenomatous polyps    Anemia    Anxiety    Cataract    forming    GERD (gastroesophageal reflux disease)    use  to   Hyperlipidemia    Hypertension      Social History   Socioeconomic History   Marital status: Single    Spouse name: Not on file   Number of children: Not on file   Years of education: Not on file   Highest education level: Not on file  Occupational History   Not on file  Tobacco Use   Smoking status: Every Day    Current packs/day: 0.00    Types: Cigars, Cigarettes    Last attempt to quit: 11/18/2000    Years since quitting: 23.4   Smokeless tobacco: Never  Vaping Use   Vaping status: Never Used  Substance and Sexual Activity   Alcohol use: Yes    Alcohol/week: 7.0 standard drinks of alcohol    Types: 7 Glasses of wine per week    Comment: wine daily glass   Drug use: No   Sexual activity: Not on file  Other Topics Concern   Not on file  Social History Narrative   Not on file   Social Drivers of Health   Financial Resource Strain: Not on file  Food Insecurity: Not on file  Transportation Needs: Not on file  Physical Activity: Not on file  Stress: Not on file  Social Connections: Not on file  Intimate Partner Violence: Not on file    Past Surgical History:  Procedure Laterality Date   COLONOSCOPY     POLYPECTOMY  TUBAL LIGATION  1991   UPPER GASTROINTESTINAL ENDOSCOPY      Family History  Problem Relation Age of Onset   Diabetes Mother    Cancer Brother        bladder   Bladder Cancer Brother    Heart disease Maternal Grandfather    Heart attack Maternal Grandfather    Anxiety disorder Other    Diabetes Other    Hypertension Other    Mental illness Other    Alcohol abuse Other    Stroke Other    Colon cancer Neg Hx    Colon polyps Neg Hx    Esophageal cancer Neg Hx    Rectal cancer Neg Hx    Stomach cancer Neg Hx    Crohn's disease Neg Hx    Ulcerative colitis Neg Hx     Allergies  Allergen Reactions   Codeine     REACTION: itching    Current Outpatient Medications on File Prior to Visit  Medication Sig Dispense Refill    ALPRAZolam  (XANAX ) 0.5 MG tablet Take 1 tablet (0.5 mg total) by mouth daily as needed. 30 tablet 2   amLODipine  (NORVASC ) 5 MG tablet Take 1 tablet (5 mg total) by mouth daily. 90 tablet 1   chlorthalidone  (HYGROTON ) 25 MG tablet Take 1 tablet (25 mg total) by mouth daily. 90 tablet 1   clotrimazole -betamethasone  (LOTRISONE ) cream Apply 1 Application topically daily. 45 g 2   cyclobenzaprine  (FLEXERIL ) 10 MG tablet Take 1 tablet (10 mg total) by mouth 3 (three) times daily as needed for muscle spasms. 30 tablet 0   losartan  (COZAAR ) 50 MG tablet Take 1 tablet (50 mg total) by mouth daily. Pt needs office visit for further refills 90 tablet 1   rosuvastatin  (CRESTOR ) 40 MG tablet Take 1 tablet (40 mg total) by mouth daily. 90 tablet 1   Semaglutide -Weight Management (WEGOVY ) 2.4 MG/0.75ML SOAJ Inject 2.4 mg into the skin once a week. 9 mL 0   terbinafine  (LAMISIL ) 250 MG tablet Take 1 tablet (250 mg total) by mouth daily. 90 tablet 0   tretinoin  (RETIN-A ) 0.025 % cream Apply topically at bedtime. 45 g 5   No current facility-administered medications on file prior to visit.    BP 122/80   Pulse 64   Temp 98.3 F (36.8 C) (Oral)   Resp 15   Ht 5' 4 (1.626 m)   Wt 141 lb (64 kg)   LMP 05/17/2015   SpO2 98%   BMI 24.20 kg/m          Objective:   Physical Exam   General Mental Status- Alert. General Appearance- Not in acute distress.   Skin General: Color- Normal Color. Moisture- Normal Moisture.  Neck Carotid Arteries- Normal color. Moisture- Normal Moisture. No carotid bruits. No JVD.  Chest and Lung Exam Auscultation: Breath Sounds:-CTA  Cardiovascular Auscultation:Rythm- RRR Murmurs & Other Heart Sounds:Auscultation of the heart reveals- No Murmurs.  Abdomen Inspection:-Inspeection Normal. Palpation/Percussion:Note:No mass. Palpation and Percussion of the abdomen reveal- Non Tender, Non Distended + BS, no rebound or guarding.   Neurologic Cranial Nerve exam:-  CN III-XII intact(No nystagmus), symmetric smile. Strength:- 5/5 equal and symmetric strength both upper and lower extremities.      Assessment & Plan:   Patient Instructions  Allergic rhinitis vs uri.(Causing cough, nasal congestion and pnd) -. COVID-19 and influenza tests negative. Differential includes allergic rhinitis versus common cold. There is a possibility of secondary bacterial infection if symptoms worsen. - Prescribe Flonase  nasal  spray for nasal congestion. - Prescribe benzonatate for cough suppression. - Prescribe azithromycin (Z-Pak) for use only if symptoms worsen, indicating possible secondary bacterial infection.(in light of fact may be traveling out of state possibly as early as monday of next week)   Follow up as needed  [employer], PA-C

## 2024-05-07 NOTE — Patient Instructions (Addendum)
 Allergic rhinitis vs uri.(Causing cough, nasal congestion and pnd) -. COVID-19 and influenza tests negative. Differential includes allergic rhinitis versus common cold. There is a possibility of secondary bacterial infection if symptoms worsen. - Prescribe Flonase  nasal spray for nasal congestion. - Prescribe benzonatate for cough suppression. - Prescribe azithromycin (Z-Pak) for use only if symptoms worsen, indicating possible secondary bacterial infection.(in light of fact may be traveling out of state possibly as early as monday of next week)  Follow up as needed.

## 2024-05-07 NOTE — Addendum Note (Signed)
 Addended by: DORINA DALLAS HERO on: 05/07/2024 10:51 AM   Modules accepted: Orders

## 2024-05-30 ENCOUNTER — Other Ambulatory Visit: Payer: Self-pay | Admitting: Family Medicine

## 2024-06-01 ENCOUNTER — Other Ambulatory Visit: Payer: Self-pay | Admitting: Medical

## 2024-08-09 ENCOUNTER — Other Ambulatory Visit: Payer: Self-pay | Admitting: Family Medicine

## 2024-08-14 ENCOUNTER — Encounter: Payer: Self-pay | Admitting: Family Medicine

## 2024-09-09 ENCOUNTER — Encounter: Payer: Self-pay | Admitting: Family Medicine
